# Patient Record
Sex: Female | Born: 1990 | Race: Asian | Hispanic: No | Marital: Married | Smoking: Never smoker
Health system: Southern US, Community
[De-identification: ages and names within clinical notes are randomized; demographics above are authoritative.]

## PROBLEM LIST (undated history)

## (undated) ENCOUNTER — Inpatient Hospital Stay (HOSPITAL_COMMUNITY): Payer: Self-pay

## (undated) DIAGNOSIS — K219 Gastro-esophageal reflux disease without esophagitis: Secondary | ICD-10-CM

---

## 2012-03-25 ENCOUNTER — Encounter (HOSPITAL_COMMUNITY): Payer: Self-pay | Admitting: *Deleted

## 2012-03-25 ENCOUNTER — Emergency Department (INDEPENDENT_AMBULATORY_CARE_PROVIDER_SITE_OTHER)
Admission: EM | Admit: 2012-03-25 | Discharge: 2012-03-25 | Disposition: A | Discharge status: Home / Self Care | Payer: Medicaid Other | Source: Home / Self Care

## 2012-03-25 DIAGNOSIS — K120 Recurrent oral aphthae: Secondary | ICD-10-CM

## 2012-03-25 MED ORDER — CHLORHEXIDINE GLUCONATE 0.12 % MT SOLN: 15.0000 mL | Freq: Two times a day (BID) | OROMUCOSAL | Status: DC

## 2012-03-25 NOTE — ED Notes (Signed)
Pt reports mouth sores for more than three days

## 2012-03-25 NOTE — ED Provider Notes (Signed)
History     CSN: [id number]  Arrival date & time 03/25/12  1245   None     Chief Complaint  Patient presents with  . Mouth Lesions    (Consider location/radiation/quality/duration/timing/severity/associated sxs/prior treatment) HPI Comments: Pt reports having mouth sores on and off for a year.  They come and usually resolve on their own after a week or two.     Patient is a 21 y.o. female presenting with mouth sores. The history is provided by the patient and a relative. A language interpreter was used (friend).  Mouth Lesions  The current episode started more than 1 week ago. The problem occurs continuously. The problem has been unchanged. The problem is moderate. Nothing relieves the symptoms. Nothing aggravates the symptoms. Associated symptoms include mouth sores. Pertinent negatives include no fever.    History reviewed. No pertinent past medical history.  History reviewed. No pertinent past surgical history.  Family History  Problem Relation Age of Onset  . Family history unknown: Yes    History  Substance Use Topics  . Smoking status: Not on file  . Smokeless tobacco: Not on file  . Alcohol Use: No    OB History    Grav Para Term Preterm Abortions TAB SAB Ect Mult Living   2               Review of Systems  Constitutional: Negative for fever, chills and fatigue.  HENT: Positive for mouth sores.     Allergies  Review of patient's allergies indicates no known allergies.  Home Medications   Current Outpatient Rx  Name Route Sig Dispense Refill  . CHLORHEXIDINE GLUCONATE 0.12 % MT SOLN Mouth/Throat Use as directed 15 mLs in the mouth or throat 2 (two) times daily. 120 mL 0    BP 119/67  Pulse 77  Temp 98.5 F (36.9 C) (Oral)  Resp 20  SpO2 100%  Physical Exam  Constitutional: She appears well-developed and well-nourished. No distress.  HENT:  Mouth/Throat: Oropharynx is clear and moist. Oral lesions present.       3 small 2mm oval shaped  ulcerations inner lower lip c/w apthous ulcers  Pulmonary/Chest: Effort normal.  Lymphadenopathy:       Head (right side): No submental, no submandibular and no tonsillar adenopathy present.       Head (left side): No submental, no submandibular and no tonsillar adenopathy present.    ED Course  Procedures (including critical care time)  Labs Reviewed - No data to display No results found.   1. Aphthous ulcer       MDM          Cathlyn Parsons, NP 03/25/12 1426

## 2012-03-25 NOTE — ED Provider Notes (Signed)
Medical screening examination/treatment/procedure(s) were performed by resident physician or non-physician practitioner and as supervising physician I was immediately available for consultation/collaboration.   Barkley Bruns MD.    Linna Hoff, MD 03/25/12 1901

## 2012-09-17 ENCOUNTER — Encounter (HOSPITAL_COMMUNITY): Payer: Self-pay | Admitting: Emergency Medicine

## 2012-09-17 ENCOUNTER — Emergency Department (HOSPITAL_COMMUNITY)
Admission: EM | Admit: 2012-09-17 | Discharge: 2012-09-17 | Disposition: A | Discharge status: Home / Self Care | Payer: Medicaid Other | Attending: Emergency Medicine | Admitting: Emergency Medicine

## 2012-09-17 DIAGNOSIS — Y929 Unspecified place or not applicable: Secondary | ICD-10-CM | POA: Insufficient documentation

## 2012-09-17 DIAGNOSIS — X118XXA Contact with other hot tap-water, initial encounter: Secondary | ICD-10-CM | POA: Insufficient documentation

## 2012-09-17 DIAGNOSIS — Z23 Encounter for immunization: Secondary | ICD-10-CM | POA: Insufficient documentation

## 2012-09-17 DIAGNOSIS — T2122XA Burn of second degree of abdominal wall, initial encounter: Secondary | ICD-10-CM | POA: Insufficient documentation

## 2012-09-17 DIAGNOSIS — Y9389 Activity, other specified: Secondary | ICD-10-CM | POA: Insufficient documentation

## 2012-09-17 DIAGNOSIS — T24219A Burn of second degree of unspecified thigh, initial encounter: Secondary | ICD-10-CM | POA: Insufficient documentation

## 2012-09-17 DIAGNOSIS — T24012A Burn of unspecified degree of left thigh, initial encounter: Secondary | ICD-10-CM

## 2012-09-17 DIAGNOSIS — T310 Burns involving less than 10% of body surface: Secondary | ICD-10-CM | POA: Insufficient documentation

## 2012-09-17 MED ORDER — IBUPROFEN 800 MG PO TABS: 800.0000 mg | ORAL_TABLET | Freq: Three times a day (TID) | ORAL | Status: DC

## 2012-09-17 MED ORDER — IBUPROFEN 400 MG PO TABS
800.0000 mg | ORAL_TABLET | Freq: Once | ORAL | Status: AC
Start: 2012-09-17 — End: 2012-09-17
  Administered 2012-09-17: 800 mg via ORAL
  Filled 2012-09-17: qty 2

## 2012-09-17 MED ORDER — FENTANYL CITRATE 0.05 MG/ML IJ SOLN
100.0000 ug | Freq: Once | INTRAMUSCULAR | Status: AC
  Administered 2012-09-17: 100 ug via NASAL
  Filled 2012-09-17: qty 2

## 2012-09-17 MED ORDER — OXYCODONE-ACETAMINOPHEN 5-325 MG PO TABS
2.0000 | ORAL_TABLET | Freq: Once | ORAL | Status: AC
  Administered 2012-09-17: 2 via ORAL
  Filled 2012-09-17: qty 2

## 2012-09-17 MED ORDER — ONDANSETRON 4 MG PO TBDP: 8.0000 mg | ORAL_TABLET | Freq: Once | ORAL | Status: DC

## 2012-09-17 MED ORDER — ONDANSETRON 4 MG PO TBDP
ORAL_TABLET | ORAL | Status: AC
  Filled 2012-09-17: qty 2

## 2012-09-17 MED ORDER — OXYCODONE-ACETAMINOPHEN 5-325 MG PO TABS: 2.0000 | ORAL_TABLET | ORAL | Status: DC | PRN

## 2012-09-17 MED ORDER — TETANUS-DIPHTH-ACELL PERTUSSIS 5-2.5-18.5 LF-MCG/0.5 IM SUSP
0.5000 mL | Freq: Once | INTRAMUSCULAR | Status: AC
  Administered 2012-09-17: 0.5 mL via INTRAMUSCULAR
  Filled 2012-09-17: qty 0.5

## 2012-09-17 NOTE — ED Notes (Signed)
Pt sts feeling dizzy and weak all over when giving tetanus shot. Dr bednar notified vitals stable. Pt laid down. sts feeling sleepy. Pt rested 10 min recheck- pt appears more alert sts wants to go home. Denies nasuea sts doesn't want to take nausea medication
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
## 2012-10-17 ENCOUNTER — Encounter (HOSPITAL_COMMUNITY): Payer: Self-pay | Admitting: Adult Health

## 2012-10-17 ENCOUNTER — Emergency Department (HOSPITAL_COMMUNITY)
Admission: EM | Admit: 2012-10-17 | Discharge: 2012-10-17 | Disposition: A | Discharge status: Home / Self Care | Payer: Medicaid Other | Attending: Emergency Medicine | Admitting: Emergency Medicine

## 2012-10-17 ENCOUNTER — Emergency Department (HOSPITAL_COMMUNITY): Payer: Medicaid Other

## 2012-10-17 DIAGNOSIS — R509 Fever, unspecified: Secondary | ICD-10-CM | POA: Insufficient documentation

## 2012-10-17 DIAGNOSIS — J309 Allergic rhinitis, unspecified: Secondary | ICD-10-CM | POA: Insufficient documentation

## 2012-10-17 DIAGNOSIS — J3489 Other specified disorders of nose and nasal sinuses: Secondary | ICD-10-CM | POA: Insufficient documentation

## 2012-10-17 MED ORDER — CETIRIZINE-PSEUDOEPHEDRINE ER 5-120 MG PO TB12: 1.0000 | ORAL_TABLET | Freq: Two times a day (BID) | ORAL | Status: DC

## 2012-10-17 NOTE — ED Notes (Signed)
Presents with 3 days of sore throat, ear ache, itchy and watery eyes, cough and nasal drainage. Denies fever. Pt speaks nepali

## 2012-10-17 NOTE — ED Provider Notes (Signed)
History    This chart was scribed for non-physician practitioner Glade Nurse, PA-C working with Loren Racer, MD by Gerlean Ren, ED Scribe. This patient was seen in room TR05C/TR05C and the patient's care was started at 9:00 PM.    CSN: [id number]  Arrival date & time 10/17/12  1955   First MD Initiated Contact with Patient 10/17/12 2038      Chief Complaint  Patient presents with  . Sore Throat     The history is provided by the patient. A language interpreter was used [place]).  Kayla Bender is a 22 y.o. female who presents to the Emergency Department complaining of 3 days of constant sore throat with associated rhinorrhea, nasal congestion, and mild cough worse during the day than at night.  Pt denies dyspnea, chest pain, SOB, abdominal pain, emesis, otalgia.      History reviewed. No pertinent past medical history.  History reviewed. No pertinent past surgical history.  History reviewed. No pertinent family history.  History  Substance Use Topics  . Smoking status: Not on file  . Smokeless tobacco: Not on file  . Alcohol Use: No    OB History   Grav Para Term Preterm Abortions TAB SAB Ect Mult Living   2               Review of Systems  Constitutional: Positive for fever (subjective). Negative for diaphoresis.  HENT: Positive for congestion, sore throat and rhinorrhea. Negative for ear pain, neck pain and neck stiffness.   Eyes: Negative for visual disturbance.  Respiratory: Negative for apnea, chest tightness and shortness of breath.   Cardiovascular: Negative for chest pain and palpitations.  Gastrointestinal: Negative for nausea, vomiting, diarrhea, constipation and abdominal distention.  Genitourinary: Negative for dysuria.  Musculoskeletal: Negative for gait problem.  Skin: Negative for rash.  Neurological: Negative for dizziness, weakness, light-headedness, numbness and headaches.    Allergies  Review of patient's allergies indicates no known  allergies.  Home Medications   Current Outpatient Rx  Name  Route  Sig  Dispense  Refill  . etonogestrel (IMPLANON) 68 MG IMPL implant   Subcutaneous   Inject 1 each into the skin once.           BP 99/52  Pulse 92  Temp(Src) 98.9 F (37.2 C) (Oral)  Resp 20  SpO2 99%  Physical Exam  Nursing note and vitals reviewed. Constitutional: She is oriented to person, place, and time. She appears well-developed and well-nourished. No distress.  HENT:  Head: Normocephalic and atraumatic.  Eyes: Conjunctivae and EOM are normal.  Neck: Normal range of motion. Neck supple.  No meningeal signs  Cardiovascular: Normal rate, regular rhythm and normal heart sounds.  Exam reveals no gallop and no friction rub.   No murmur heard. Pulmonary/Chest: Effort normal and breath sounds normal. No respiratory distress. She has no wheezes. She has no rales. She exhibits no tenderness.  Abdominal: Soft. Bowel sounds are normal. She exhibits no distension. There is no tenderness. There is no rebound and no guarding.  Musculoskeletal: Normal range of motion. She exhibits no edema and no tenderness.  Neurological: She is alert and oriented to person, place, and time. No cranial nerve deficit.  Skin: Skin is warm and dry. She is not diaphoretic. No erythema.    ED Course  Procedures (including critical care time) DIAGNOSTIC STUDIES: Oxygen Saturation is 99% on room air, normal by my interpretation.    COORDINATION OF CARE: 9:08 PM- Informed pt that I  will await results of strep test and chest XR to determine further treatment plan.  Pt verbalizes understanding.    Results for orders placed during the hospital encounter of 10/17/12  RAPID STREP SCREEN      Result Value Range   Streptococcus, Group A Screen (Direct) NEGATIVE  NEGATIVE    Dg Chest 2 View  10/17/2012  *RADIOLOGY REPORT*  Clinical Data: Sore throat.  CHEST - 2 VIEW  Comparison: None.  Findings: Lateral view degraded by patient arm  position.  Midline trachea.  Patient rotated to the right. Normal heart size and mediastinal contours. No pleural effusion or pneumothorax. Clear lungs.  IMPRESSION: No acute cardiopulmonary disease.   Original Report Authenticated By: Jeronimo Greaves, M.D.    Discharge Medication List as of 10/17/2012 10:43 PM    START taking these medications   Details  cetirizine-pseudoephedrine (ZYRTEC-D) 5-120 MG per tablet Take 1 tablet by mouth 2 (two) times daily., Starting 10/17/2012, Until Discontinued, Print      CONTINUE these medications which have NOT CHANGED   Details  etonogestrel (IMPLANON) 68 MG IMPL implant Inject 1 each into the skin once., Historical Med         1. Allergic rhinitis       MDM  22 y.o. female complaining of 3 days of sore throat, clear rhinorrhea, itchy/watery eyes, nasal congestion, and mild cough worse during the day. Pt is afebrile. Lack of exudates, mild erythematous throat, and neg strep not concerning for strep. No sinus tenderness to percussion. Conjunctiva mildly injected. PE consistent with allergic rhinitis. CXR negative. Lungs CTA. Presentation consistent with allergic rhinitis. Will prescribe Zyrtec D for relief of symptoms. Encouraged pt to establish relationship with PCP for follow up and provided resource guide. Pt understood and was in agreement with sx.   I personally performed the services described in this documentation, which was scribed in my presence. The recorded information has been reviewed and is accurate.    Glade Nurse, PA-C 10/18/12 [phone]

## 2012-10-17 NOTE — ED Notes (Signed)
Language line contacted to assess pt.;  Pt stated to interpreter that she has had runny nose, itching in eyes, ears, nose, and throat for three days with water coming from eyes.  She also complains of itching of skin around left side of neck.

## 2012-10-18 NOTE — ED Provider Notes (Signed)
Medical screening examination/treatment/procedure(s) were performed by non-physician practitioner and as supervising physician I was immediately available for consultation/collaboration.   Valeta Paz, MD 10/18/12 2153 

## 2012-10-21 ENCOUNTER — Emergency Department (INDEPENDENT_AMBULATORY_CARE_PROVIDER_SITE_OTHER): Payer: Medicaid Other

## 2012-10-21 ENCOUNTER — Emergency Department (INDEPENDENT_AMBULATORY_CARE_PROVIDER_SITE_OTHER)
Admission: EM | Admit: 2012-10-21 | Discharge: 2012-10-21 | Disposition: A | Discharge status: Home / Self Care | Payer: Medicaid Other | Source: Home / Self Care | Attending: Emergency Medicine | Admitting: Emergency Medicine

## 2012-10-21 ENCOUNTER — Encounter (HOSPITAL_COMMUNITY): Payer: Self-pay | Admitting: Emergency Medicine

## 2012-10-21 DIAGNOSIS — B349 Viral infection, unspecified: Secondary | ICD-10-CM

## 2012-10-21 DIAGNOSIS — B9789 Other viral agents as the cause of diseases classified elsewhere: Secondary | ICD-10-CM

## 2012-10-21 LAB — CBC WITH DIFFERENTIAL/PLATELET
Basophils Absolute: 0 10*3/uL (ref 0.0–0.1)
Eosinophils Absolute: 0.1 10*3/uL (ref 0.0–0.7)
Eosinophils Relative: 1 % (ref 0–5)
HCT: 33.5 % — ABNORMAL LOW (ref 36.0–46.0)
Lymphs Abs: 3.8 10*3/uL (ref 0.7–4.0)
MCH: 30.3 pg (ref 26.0–34.0)
MCV: 86.1 fL (ref 78.0–100.0)
Monocytes Absolute: 0.7 10*3/uL (ref 0.1–1.0)
Platelets: 114 10*3/uL — ABNORMAL LOW (ref 150–400)
RDW: 12.9 % (ref 11.5–15.5)

## 2012-10-21 LAB — POCT URINALYSIS DIP (DEVICE)
Glucose, UA: NEGATIVE mg/dL
Hgb urine dipstick: NEGATIVE
Nitrite: NEGATIVE
Protein, ur: NEGATIVE mg/dL
Specific Gravity, Urine: 1.01 (ref 1.005–1.030)
Urobilinogen, UA: 0.2 mg/dL (ref 0.0–1.0)

## 2012-10-21 LAB — POCT I-STAT, CHEM 8
Creatinine, Ser: 0.7 mg/dL (ref 0.50–1.10)
Glucose, Bld: 84 mg/dL (ref 70–99)
HCT: 37 % (ref 36.0–46.0)
Hemoglobin: 12.6 g/dL (ref 12.0–15.0)
Potassium: 3.5 mEq/L (ref 3.5–5.1)
TCO2: 26 mmol/L (ref 0–100)

## 2012-10-21 MED ORDER — ACETAMINOPHEN 325 MG PO TABS
650.0000 mg | ORAL_TABLET | Freq: Once | ORAL | Status: AC
  Administered 2012-10-21: 650 mg via ORAL

## 2012-10-21 MED ORDER — ONDANSETRON HCL 8 MG PO TABS: 8.0000 mg | ORAL_TABLET | Freq: Three times a day (TID) | ORAL | Status: DC | PRN

## 2012-10-21 MED ORDER — OXYCODONE-ACETAMINOPHEN 5-325 MG PO TABS: ORAL_TABLET | ORAL | Status: DC

## 2012-10-21 MED ORDER — ACETAMINOPHEN 325 MG PO TABS
ORAL_TABLET | ORAL | Status: AC
  Filled 2012-10-21: qty 2

## 2012-10-21 MED ORDER — ONDANSETRON HCL 4 MG/2ML IJ SOLN
INTRAMUSCULAR | Status: AC
  Filled 2012-10-21: qty 2

## 2012-10-21 MED ORDER — TRIAMCINOLONE ACETONIDE 0.1 % EX CREA: TOPICAL_CREAM | Freq: Three times a day (TID) | CUTANEOUS | Status: DC

## 2012-10-21 MED ORDER — ONDANSETRON HCL 4 MG/2ML IJ SOLN
4.0000 mg | Freq: Once | INTRAMUSCULAR | Status: AC
  Administered 2012-10-21: 4 mg via INTRAMUSCULAR

## 2012-10-21 MED ORDER — SODIUM CHLORIDE 0.9 % IV SOLN
INTRAVENOUS | Status: DC
  Administered 2012-10-21: 17:00:00 via INTRAVENOUS

## 2012-10-21 NOTE — ED Provider Notes (Signed)
Chief Complaint:  No chief complaint on file.   History of Present Illness:   Kayla Bender is a 22 year old Nepali female who presents today with her husband and 2 small children. She speaks limited [place], so history was obtained with the help of a telephone interpreter. The past week she's had sore throat, nasal congestion, rhinorrhea, and cough. She was seen for these symptoms on April 23, 4 days ago at the emergency department. A strep test was negative and a chest x-ray was negative. She was treated symptomatically and sent home. She continues with the above-mentioned symptoms but also notes that she feels dizzy, weak, numb and tingly in her extremities, she's had some back pain, fever, and headache. She continues to have cough productive of white sputum, chest pain, sore throat, difficulty breathing, nausea, generalized abdominal pain, and loose stools. She denies any vomiting, blood in the stool, urinary symptoms, or GYN complaints. She also mentions today a four-month history of a rash and dark discoloration of her skin in various places including her forehead, neck, and arms.  Review of Systems:  Other than noted above, the patient denies any of the following symptoms. Systemic:  No fever, chills, sweats, fatigue, myalgias, headache, or anorexia. Eye:  No redness, pain or drainage. ENT:  No earache, nasal congestion, rhinorrhea, sinus pressure, or sore throat. Lungs:  No cough, sputum production, wheezing, shortness of breath.  Cardiovascular:  No chest pain, palpitations, or syncope. GI:  No nausea, vomiting, abdominal pain or diarrhea. GU:  No dysuria, frequency, or hematuria. Skin:  No rash or pruritis.  PMFSH:  Past medical history, family history, social history, meds, and allergies were reviewed.  She has a Norplant IUD and has not had menstrual periods.  Physical Exam:   Vital signs:  BP 102/62  Pulse 86  Temp(Src) 99 F (37.2 C) (Oral)  Resp 16  SpO2 100% General:  She is  alert but drowsy and listless and is lying, curled up in a fetal position on the exam table covered up with a blanket. Her husband does most of the talking for her. Eye:  PERRL, full EOMs.  Lids and conjunctivas were normal. ENT:  TMs and canals were normal, without erythema or inflammation.  Nasal mucosa was clear and uncongested, without drainage.  Mucous membranes were moist.  Pharynx was clear, without exudate or drainage.  There were no oral ulcerations or lesions. Neck:  Supple, no adenopathy, tenderness or mass. Thyroid was normal. Lungs:  No respiratory distress.  Lungs were clear to auscultation, without wheezes, rales or rhonchi.  Breath sounds were clear and equal bilaterally. Heart:  Regular rhythm, without gallops, murmers or rubs. Abdomen:  Soft, flat, with mild, generalized tenderness to palpation, no guarding or rebound.  No hepatosplenomagaly or mass. Bowel sounds are hyperactive. Skin:  Clear, warm, and dry, without rash or lesions. I was unable to appreciate any definite rash. Some of the areas that she indicated were slightly dry and eczematous.  Labs:   Results for orders placed during the hospital encounter of 10/21/12  CBC WITH DIFFERENTIAL      Result Value Range   WBC 9.6  4.0 - 10.5 K/uL   RBC 3.89  3.87 - 5.11 MIL/uL   Hemoglobin 11.8 (*) 12.0 - 15.0 g/dL   HCT 84.1 (*) 32.4 - 40.1 %   MCV 86.1  78.0 - 100.0 fL   MCH 30.3  26.0 - 34.0 pg   MCHC 35.2  30.0 - 36.0 g/dL  RDW 12.9  11.5 - 15.5 %   Platelets 114 (*) 150 - 400 K/uL   Neutrophils Relative 52  43 - 77 %   Neutro Abs 5.0  1.7 - 7.7 K/uL   Lymphocytes Relative 39  12 - 46 %   Lymphs Abs 3.8  0.7 - 4.0 K/uL   Monocytes Relative 7  3 - 12 %   Monocytes Absolute 0.7  0.1 - 1.0 K/uL   Eosinophils Relative 1  0 - 5 %   Eosinophils Absolute 0.1  0.0 - 0.7 K/uL   Basophils Relative 0  0 - 1 %   Basophils Absolute 0.0  0.0 - 0.1 K/uL  POCT URINALYSIS DIP (DEVICE)      Result Value Range   Glucose, UA  NEGATIVE  NEGATIVE mg/dL   Bilirubin Urine NEGATIVE  NEGATIVE   Ketones, ur NEGATIVE  NEGATIVE mg/dL   Specific Gravity, Urine 1.010  1.005 - 1.030   Hgb urine dipstick NEGATIVE  NEGATIVE   pH 6.5  5.0 - 8.0   Protein, ur NEGATIVE  NEGATIVE mg/dL   Urobilinogen, UA 0.2  0.0 - 1.0 mg/dL   Nitrite NEGATIVE  NEGATIVE   Leukocytes, UA NEGATIVE  NEGATIVE  POCT I-STAT, CHEM 8      Result Value Range   Sodium 142  135 - 145 mEq/L   Potassium 3.5  3.5 - 5.1 mEq/L   Chloride 103  96 - 112 mEq/L   BUN 15  6 - 23 mg/dL   Creatinine, Ser 1.61  0.50 - 1.10 mg/dL   Glucose, Bld 84  70 - 99 mg/dL   Calcium, Ion 0.96 (*) 1.12 - 1.23 mmol/L   TCO2 26  0 - 100 mmol/L   Hemoglobin 12.6  12.0 - 15.0 g/dL   HCT 04.5  40.9 - 81.1 %  POCT PREGNANCY, URINE      Result Value Range   Preg Test, Ur NEGATIVE  NEGATIVE     Radiology:  Dg Chest 2 View  10/21/2012  *RADIOLOGY REPORT*  Clinical Data: Cough and fever for the past week.  CHEST - 2 VIEW  Comparison: Chest x-ray 10/17/2012.  Findings: Lung volumes are normal.  No consolidative airspace disease.  No pleural effusions.  No pneumothorax.  No pulmonary nodule or mass noted.  Pulmonary vasculature and the cardiomediastinal silhouette are within normal limits.  IMPRESSION: 1. No radiographic evidence of acute cardiopulmonary disease.   Original Report Authenticated By: Trudie Reed, M.D.    Course in Urgent Care Center:   She was begun on IV normal saline and given a total of a liter while she was here. She was given Tylenol for the fever and the pain, and Zofran 4 mg IV for the nausea. She was given some ginger ale to drink and some crackers to eat. She ate a few crackers but did not feel like drinking the ginger ale. After treatment, she stated her back pain was down from a 5 to a 4 in intensity and her nausea is gone away completely. She felt like she could go home at this point. I discussed with the husband and her about the possibility of transfer to  the hospital for further evaluation, but they were both inclined to go home.  Assessment:  The encounter diagnosis was Viral syndrome.  This appears to be a viral syndrome, although it has gone on for a very long time. I'm also somewhat concerned about the somewhat low platelet count. I have asked  him to come back tomorrow and would like to recheck her CBC at that time. If she's not getting better, or if the platelet count keeps dropping, she'll need to be transferred to the hospital. The rash that she mentions may be eczema, and at [facility] triamcinolone cream for that. She was given oxycodone for the pain and Zofran for the nausea. I recommended plenty of liquids and rest.  Plan:   1.  The following meds were prescribed:   New Prescriptions   ONDANSETRON (ZOFRAN) 8 MG TABLET    Take 1 tablet (8 mg total) by mouth every 8 (eight) hours as needed for nausea.   OXYCODONE-ACETAMINOPHEN (PERCOCET) 5-325 MG PER TABLET    1 to 2 tablets every 6 hours as needed for pain.   TRIAMCINOLONE CREAM (KENALOG) 0.1 %    Apply topically 3 (three) times daily.   2.  The patient was instructed in symptomatic care and handouts were given. 3.  The patient was told to return if becoming worse in any way, if no better in 3 or 4 days, and given some red flag symptoms such as worsening fever, worsening pain, or any type of respiratory problem that would indicate earlier return.  Follow up:  The patient was told to follow up here tomorrow for recheck. She will need a repeat CBC.     Reuben Likes, MD 10/21/12 [phone]
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
## 2012-10-21 NOTE — ED Notes (Addendum)
C/o dizzy and week with back pain for 1 week.  C/o feeling feverish with numbness in hand and legs.  Weak non-productive cough.  C/o nasuea. C/o chest pain off and on.  C/o cold symptoms with sorethroat.  No vomiting.  C/o abdominal pain  And cramping.  Stool is soft and watery.  No blood in stool.  No dysuria or hematuria.  No itching or burning.  No periods on Norplant for birth control. History obtained with [place] interpreters, with husband and Dr. Lorenz Coaster present.

## 2012-10-22 ENCOUNTER — Emergency Department (INDEPENDENT_AMBULATORY_CARE_PROVIDER_SITE_OTHER)
Admission: EM | Admit: 2012-10-22 | Discharge: 2012-10-22 | Disposition: A | Discharge status: Home / Self Care | Payer: Medicaid Other | Source: Home / Self Care | Attending: Family Medicine | Admitting: Family Medicine

## 2012-10-22 ENCOUNTER — Encounter (HOSPITAL_COMMUNITY): Payer: Self-pay

## 2012-10-22 DIAGNOSIS — B9789 Other viral agents as the cause of diseases classified elsewhere: Secondary | ICD-10-CM

## 2012-10-22 DIAGNOSIS — B349 Viral infection, unspecified: Secondary | ICD-10-CM

## 2012-10-22 NOTE — ED Provider Notes (Signed)
History     CSN: [id number]  Arrival date & time 10/22/12  1433   First MD Initiated Contact with Patient 10/22/12 1549      No chief complaint on file.   (Consider location/radiation/quality/duration/timing/severity/associated sxs/prior treatment) Patient is a 22 y.o. female presenting with general illness. The history is provided by the patient and the spouse. The history is limited by a language barrier.  Illness  The current episode started yesterday (seen at [facility] on 4/27 but today sx much improved.). The problem has been rapidly improving. The problem is mild.    No past medical history on file.  No past surgical history on file.  No family history on file.  History  Substance Use Topics  . Smoking status: Not on file  . Smokeless tobacco: Not on file  . Alcohol Use: No    OB History   Grav Para Term Preterm Abortions TAB SAB Ect Mult Living   2               Review of Systems  Constitutional: Negative.   Respiratory: Negative.   Gastrointestinal: Negative.     Allergies  Review of patient's allergies indicates no known allergies.  Home Medications   Current Outpatient Rx  Name  Route  Sig  Dispense  Refill  . cetirizine-pseudoephedrine (ZYRTEC-D) 5-120 MG per tablet   Oral   Take 1 tablet by mouth 2 (two) times daily.   30 tablet   0   . etonogestrel (IMPLANON) 68 MG IMPL implant   Subcutaneous   Inject 1 each into the skin once.         . ondansetron (ZOFRAN) 8 MG tablet   Oral   Take 1 tablet (8 mg total) by mouth every 8 (eight) hours as needed for nausea.   20 tablet   0   . oxyCODONE-acetaminophen (PERCOCET) 5-325 MG per tablet      1 to 2 tablets every 6 hours as needed for pain.   20 tablet   0   . triamcinolone cream (KENALOG) 0.1 %   Topical   Apply topically 3 (three) times daily.   85.2 g   2     BP 104/59  Pulse 76  Temp(Src) 98 F (36.7 C) (Oral)  Resp 16  SpO2 100%  Physical Exam  Nursing note and vitals  reviewed. Constitutional: She is oriented to person, place, and time. She appears well-developed and well-nourished. No distress.  HENT:  Head: Normocephalic.  Mouth/Throat: Oropharynx is clear and moist.  Eyes: Pupils are equal, round, and reactive to light.  Cardiovascular: Normal rate, normal heart sounds and intact distal pulses.   Pulmonary/Chest: Effort normal and breath sounds normal.  Abdominal: Soft. Bowel sounds are normal.  Neurological: She is alert and oriented to person, place, and time.  Skin: Skin is warm and dry.    ED Course  Procedures (including critical care time)  Labs Reviewed - No data to display Dg Chest 2 View  10/21/2012  *RADIOLOGY REPORT*  Clinical Data: Cough and fever for the past week.  CHEST - 2 VIEW  Comparison: Chest x-ray 10/17/2012.  Findings: Lung volumes are normal.  No consolidative airspace disease.  No pleural effusions.  No pneumothorax.  No pulmonary nodule or mass noted.  Pulmonary vasculature and the cardiomediastinal silhouette are within normal limits.  IMPRESSION: 1. No radiographic evidence of acute cardiopulmonary disease.   Original Report Authenticated By: Trudie Reed, M.D.      1. Acute  viral syndrome       MDM  Do not see indication for cbc at this time, clinically much improved status.        Linna Hoff, MD 10/22/12 1630

## 2012-10-22 NOTE — ED Notes (Signed)
recheck

## 2012-11-24 ENCOUNTER — Emergency Department (HOSPITAL_COMMUNITY)
Admission: EM | Admit: 2012-11-24 | Discharge: 2012-11-25 | Disposition: A | Discharge status: Home / Self Care | Payer: Medicaid Other | Attending: Emergency Medicine | Admitting: Emergency Medicine

## 2012-11-24 ENCOUNTER — Encounter (HOSPITAL_COMMUNITY): Payer: Self-pay | Admitting: Emergency Medicine

## 2012-11-24 DIAGNOSIS — Z792 Long term (current) use of antibiotics: Secondary | ICD-10-CM | POA: Insufficient documentation

## 2012-11-24 DIAGNOSIS — Z79899 Other long term (current) drug therapy: Secondary | ICD-10-CM | POA: Insufficient documentation

## 2012-11-24 DIAGNOSIS — J351 Hypertrophy of tonsils: Secondary | ICD-10-CM

## 2012-11-24 MED ORDER — CLINDAMYCIN PHOSPHATE 900 MG/50ML IV SOLN
900.0000 mg | Freq: Once | INTRAVENOUS | Status: AC
  Administered 2012-11-25: 900 mg via INTRAVENOUS
  Filled 2012-11-24: qty 50

## 2012-11-24 MED ORDER — SODIUM CHLORIDE 0.9 % IV SOLN
Freq: Once | INTRAVENOUS | Status: AC
  Administered 2012-11-25: 01:00:00 via INTRAVENOUS

## 2012-11-24 NOTE — ED Notes (Signed)
Patient with sore throat for last two days.  Patient now with painful swallowing.  NAD, patient with easy respirations, no distress.

## 2012-11-25 ENCOUNTER — Emergency Department (HOSPITAL_COMMUNITY): Payer: Medicaid Other

## 2012-11-25 ENCOUNTER — Encounter (HOSPITAL_COMMUNITY): Payer: Self-pay | Admitting: Radiology

## 2012-11-25 LAB — CBC WITH DIFFERENTIAL/PLATELET
Basophils Absolute: 0 10*3/uL (ref 0.0–0.1)
HCT: 34.8 % — ABNORMAL LOW (ref 36.0–46.0)
Lymphocytes Relative: 43 % (ref 12–46)
Lymphs Abs: 3.7 10*3/uL (ref 0.7–4.0)
MCV: 88.3 fL (ref 78.0–100.0)
Neutro Abs: 4.1 10*3/uL (ref 1.7–7.7)
Platelets: 151 10*3/uL (ref 150–400)
RBC: 3.94 MIL/uL (ref 3.87–5.11)
RDW: 13.1 % (ref 11.5–15.5)
WBC: 8.5 10*3/uL (ref 4.0–10.5)

## 2012-11-25 LAB — POCT I-STAT, CHEM 8
BUN: 13 mg/dL (ref 6–23)
Chloride: 105 mEq/L (ref 96–112)
Creatinine, Ser: 0.7 mg/dL (ref 0.50–1.10)
Potassium: 3.5 mEq/L (ref 3.5–5.1)
Sodium: 142 mEq/L (ref 135–145)

## 2012-11-25 MED ORDER — TRAMADOL HCL 50 MG PO TABS: 50.0000 mg | ORAL_TABLET | Freq: Four times a day (QID) | ORAL | Status: DC | PRN

## 2012-11-25 MED ORDER — IOHEXOL 300 MG/ML  SOLN
75.0000 mL | Freq: Once | INTRAMUSCULAR | Status: AC | PRN
  Administered 2012-11-25: 75 mL via INTRAVENOUS

## 2012-11-25 MED ORDER — CLINDAMYCIN HCL 150 MG PO CAPS: 150.0000 mg | ORAL_CAPSULE | Freq: Four times a day (QID) | ORAL | Status: DC

## 2012-11-25 NOTE — ED Provider Notes (Signed)
History     CSN: [id number]  Arrival date & time 11/24/12  2030   First MD Initiated Contact with Patient 11/24/12 2340      Chief Complaint  Patient presents with  . Sore Throat    (Consider location/radiation/quality/duration/timing/severity/associated sxs/prior treatment) HPI Comments: Patient presents with 2 days of sore throat, more on the right than the left painful swallowing.  She is taking over-the-counter medication.  One or 2 times without relief.  Denies any URI symptoms, ear pain, cough, myalgias, or fever  Patient is a 22 y.o. female presenting with pharyngitis. The history is provided by the patient and the spouse.  Sore Throat This is a new problem. The current episode started in the past 7 days. The problem has been rapidly worsening. Associated symptoms include neck pain, a sore throat and swollen glands. Pertinent negatives include no abdominal pain, chills, coughing, fever, myalgias, nausea or vomiting. The symptoms are aggravated by drinking and eating. She has tried NSAIDs for the symptoms. The treatment provided no relief.    History reviewed. No pertinent past medical history.  History reviewed. No pertinent past surgical history.  No family history on file.  History  Substance Use Topics  . Smoking status: Never Smoker   . Smokeless tobacco: Not on file  . Alcohol Use: No    OB History   Grav Para Term Preterm Abortions TAB SAB Ect Mult Living   2               Review of Systems  Constitutional: Negative for fever and chills.  HENT: Positive for sore throat and neck pain. Negative for ear pain, rhinorrhea and neck stiffness.   Respiratory: Negative for cough.   Gastrointestinal: Negative for nausea, vomiting and abdominal pain.  Musculoskeletal: Negative for myalgias.  All other systems reviewed and are negative.    Allergies  Review of patient's allergies indicates no known allergies.  Home Medications   Current Outpatient Rx  Name   Route  Sig  Dispense  Refill  . etonogestrel (IMPLANON) 68 MG IMPL implant   Subcutaneous   Inject 1 each into the skin once.         . clindamycin (CLEOCIN) 150 MG capsule   Oral   Take 1 capsule (150 mg total) by mouth every 6 (six) hours.   28 capsule   0   . traMADol (ULTRAM) 50 MG tablet   Oral   Take 1 tablet (50 mg total) by mouth every 6 (six) hours as needed for pain.   15 tablet   0     BP 95/44  Pulse 64  Temp(Src) 97.7 F (36.5 C) (Oral)  Resp 16  SpO2 100%  Physical Exam  Nursing note and vitals reviewed. Constitutional: She is oriented to person, place, and time. She appears well-developed and well-nourished.  HENT:  Head: Normocephalic and atraumatic.  Right Ear: External ear normal.  Left Ear: External ear normal.  Patient has asymmetrical adenopathy in the posterior pharynx right greater than left negative.  Strep test  Eyes: Pupils are equal, round, and reactive to light.  Neck: Normal range of motion and full passive range of motion without pain. Neck supple. No erythema and normal range of motion present. No thyromegaly present.  Cardiovascular: Normal rate.   Pulmonary/Chest: Effort normal.  Abdominal: Soft. Bowel sounds are normal.  Musculoskeletal: Normal range of motion.  Lymphadenopathy:    She has cervical adenopathy.  Neurological: She is alert and oriented to person,  place, and time.  Skin: Skin is warm.    ED Course  Procedures (including critical care time)  Labs Reviewed  CBC WITH DIFFERENTIAL - Abnormal; Notable for the following:    Hemoglobin 11.8 (*)    HCT 34.8 (*)    All other components within normal limits  POCT I-STAT, CHEM 8 - Abnormal; Notable for the following:    Glucose, Bld 108 (*)    Hemoglobin 11.9 (*)    HCT 35.0 (*)    All other components within normal limits  RAPID STREP SCREEN  CULTURE, GROUP A STREP   Ct Soft Tissue Neck W Contrast  11/25/2012   *RADIOLOGY REPORT*  Clinical Data: Sore throat  CT NECK  WITH CONTRAST  Technique:  Multidetector CT imaging of the neck was performed with intravenous contrast.  Contrast: 75mL OMNIPAQUE IOHEXOL 300 MG/ML  SOLN  Comparison: None.  Findings: Visualized intracranial contents are within normal limits.  Unremarkable nasal cavity and nasopharynx, oral cavity.  Mild tonsillar prominence on the right.  No peritonsillar abscess.  Unremarkable epiglottis, hypopharynx, larynx.  Symmetric submandibular and parotid glands.  No lymphadenopathy  Unremarkable thyroid gland.  Upper lungs clear.  No acute osseous finding. Prevertebral soft tissue within normal limits.  Patent vasculature.  IMPRESSION: Mild tonsillar enlargement may reflect inflammation.  No peritonsillar abscess.   Original Report Authenticated By: Jearld Lesch, M.D.     1. Tonsillar enlargement       MDM   Concern for peritonsillar abscess.  CT scan, shows, that it's just a single tonsillar adenopathy without posterior pharynx abscess.  Will discharge patient him with antibiotics, and ENT followup        Arman Filter, NP 11/25/12 [phone]

## 2012-11-25 NOTE — ED Provider Notes (Signed)
Medical screening examination/treatment/procedure(s) were performed by non-physician practitioner and as supervising physician I was immediately available for consultation/collaboration.  Juliet Rude. Rubin Payor, MD 11/25/12 [phone]

## 2012-11-25 NOTE — ED Notes (Addendum)
Pt states that she has had a sore throat since 3 days ago. Pt states that the pain is severe an she is unable to swallow regularly. Pt has severe redness to back of throat concerning for a peritonsiler abscess.

## 2012-11-25 NOTE — ED Notes (Signed)
The pts iv hung  And antibiotic

## 2012-11-27 LAB — CULTURE, GROUP A STREP

## 2012-12-02 ENCOUNTER — Emergency Department (INDEPENDENT_AMBULATORY_CARE_PROVIDER_SITE_OTHER)
Admission: EM | Admit: 2012-12-02 | Discharge: 2012-12-02 | Disposition: A | Discharge status: Home / Self Care | Payer: Medicaid Other | Source: Home / Self Care | Attending: Emergency Medicine | Admitting: Emergency Medicine

## 2012-12-02 ENCOUNTER — Encounter (HOSPITAL_COMMUNITY): Payer: Self-pay

## 2012-12-02 DIAGNOSIS — K2970 Gastritis, unspecified, without bleeding: Secondary | ICD-10-CM

## 2012-12-02 MED ORDER — OMEPRAZOLE 2 MG/ML ORAL SUSPENSION: 40.0000 mg | Freq: Every day | ORAL | Status: DC

## 2012-12-02 MED ORDER — SUCRALFATE 1 G PO TABS: 1.0000 g | ORAL_TABLET | Freq: Every evening | ORAL | Status: DC | PRN

## 2012-12-02 NOTE — ED Provider Notes (Addendum)
History     CSN: [id number]  Arrival date & time 12/02/12  1205   First MD Initiated Contact with Patient 12/02/12 1255      Chief Complaint  Patient presents with  . Abdominal Pain    epigastric     (Consider location/radiation/quality/duration/timing/severity/associated sxs/prior treatment) HPI Comments: Patient presents urgent care complaining of abdominal pain and pain when she swallows. Her husband accompanies her to today's visit. He describes that when she eats or during this she hurts points to retrosternal and epigastric region. No fevers nausea vomiting or diarrhea. Has not taken any medicines over-the-counter. No urinary symptoms.   Patient is a 22 y.o. female presenting with abdominal pain. The history is provided by the patient and the spouse.  Abdominal Pain This is a new problem. The current episode started 2 days ago. The problem occurs constantly. The problem has been gradually worsening. Associated symptoms include abdominal pain. Pertinent negatives include no chest pain, no headaches and no shortness of breath. The symptoms are aggravated by swallowing. Nothing relieves the symptoms. She has tried nothing for the symptoms.    History reviewed. No pertinent past medical history.  History reviewed. No pertinent past surgical history.  History reviewed. No pertinent family history.  History  Substance Use Topics  . Smoking status: Never Smoker   . Smokeless tobacco: Not on file  . Alcohol Use: No    OB History   Grav Para Term Preterm Abortions TAB SAB Ect Mult Living   2               Review of Systems  Constitutional: Positive for appetite change. Negative for fever, diaphoresis, fatigue and unexpected weight change.  Respiratory: Negative for cough and shortness of breath.   Cardiovascular: Negative for chest pain.  Gastrointestinal: Positive for abdominal pain. Negative for vomiting, diarrhea, blood in stool, anal bleeding and rectal pain.  Skin:  Negative for rash.  Neurological: Negative for headaches.    Allergies  Review of patient's allergies indicates no known allergies.  Home Medications   Current Outpatient Rx  Name  Route  Sig  Dispense  Refill  . clindamycin (CLEOCIN) 150 MG capsule   Oral   Take 1 capsule (150 mg total) by mouth every 6 (six) hours.   28 capsule   0   . etonogestrel (IMPLANON) 68 MG IMPL implant   Subcutaneous   Inject 1 each into the skin once.         Marland Kitchen omeprazole (PRILOSEC) 2 mg/mL SUSP   Oral   Take 20 mLs (40 mg total) by mouth daily.   120 mL   0   . sucralfate (CARAFATE) 1 G tablet   Oral   Take 1 tablet (1 g total) by mouth at bedtime as needed.   5 tablet   0   . traMADol (ULTRAM) 50 MG tablet   Oral   Take 1 tablet (50 mg total) by mouth every 6 (six) hours as needed for pain.   15 tablet   0     BP 106/67  Pulse 63  Temp(Src) 98.1 F (36.7 C) (Oral)  Resp 17  SpO2 100%  Physical Exam  Nursing note and vitals reviewed. Constitutional: Vital signs are normal. She appears well-developed and well-nourished. She appears toxic. She does not have a sickly appearance.  HENT:  Head: Normocephalic.  Mouth/Throat: Uvula is midline and mucous membranes are normal. Mucous membranes are not dry and not cyanotic. No oropharyngeal exudate, posterior oropharyngeal  edema, posterior oropharyngeal erythema or tonsillar abscesses.  Eyes: Conjunctivae are normal. No scleral icterus.  Pulmonary/Chest: Effort normal.  Abdominal: Soft. She exhibits no distension and no mass. There is tenderness in the epigastric area. There is no rebound and no guarding.    Neurological: She is alert.  Skin: Skin is warm. No rash noted. No erythema.    ED Course  Procedures (including critical care time)  Labs Reviewed - No data to display No results found.   1. Esophagitis   2. Gastritis       MDM  Patient is afebrile coma with a soft abdomen. Current symptoms and exam was most  consistent with gastritis/esophagitis will treat patient with a course of PPI along with supraclavicular be taken at night for the next 5 nights. Have discussed with her husband symptoms that should warrant further evaluation in the emergency department.        Jimmie Molly, MD 12/02/12 1345  Jimmie Molly, MD 12/02/12 1346

## 2012-12-02 NOTE — ED Notes (Signed)
C/o epigastric pain and tongue pain for 2 days states that she is no longer taking medications that were prescribed on June 1 in ER

## 2012-12-02 NOTE — ED Notes (Addendum)
Asked by registration to evaluate patient.  Patient speak limited english.  History obtained from patient's husband.  Patient complains of epigastric pain x 2 days that is worse with eating or drinking.  Patient also complains of pain when breathing.  Patient's vital signs WNL compared to visit on 11/25/2012.  Patient history and vital signs reviewed with Michiel Cowboy RN.  Patient escorted back to waiting room.  Patient's husband made ware to let us know if anything changes

## 2012-12-04 ENCOUNTER — Encounter (HOSPITAL_COMMUNITY): Payer: Self-pay

## 2012-12-04 ENCOUNTER — Emergency Department (HOSPITAL_COMMUNITY)
Admission: EM | Admit: 2012-12-04 | Discharge: 2012-12-04 | Disposition: A | Discharge status: Home / Self Care | Payer: Medicaid Other | Attending: Emergency Medicine | Admitting: Emergency Medicine

## 2012-12-04 ENCOUNTER — Emergency Department (HOSPITAL_COMMUNITY): Payer: Medicaid Other

## 2012-12-04 DIAGNOSIS — R110 Nausea: Secondary | ICD-10-CM | POA: Insufficient documentation

## 2012-12-04 DIAGNOSIS — R1310 Dysphagia, unspecified: Secondary | ICD-10-CM | POA: Insufficient documentation

## 2012-12-04 DIAGNOSIS — R599 Enlarged lymph nodes, unspecified: Secondary | ICD-10-CM | POA: Insufficient documentation

## 2012-12-04 DIAGNOSIS — R630 Anorexia: Secondary | ICD-10-CM | POA: Insufficient documentation

## 2012-12-04 DIAGNOSIS — R509 Fever, unspecified: Secondary | ICD-10-CM | POA: Insufficient documentation

## 2012-12-04 DIAGNOSIS — R1013 Epigastric pain: Secondary | ICD-10-CM | POA: Insufficient documentation

## 2012-12-04 DIAGNOSIS — K1370 Unspecified lesions of oral mucosa: Secondary | ICD-10-CM | POA: Insufficient documentation

## 2012-12-04 DIAGNOSIS — Z3202 Encounter for pregnancy test, result negative: Secondary | ICD-10-CM | POA: Insufficient documentation

## 2012-12-04 DIAGNOSIS — Z79899 Other long term (current) drug therapy: Secondary | ICD-10-CM | POA: Insufficient documentation

## 2012-12-04 DIAGNOSIS — K209 Esophagitis, unspecified without bleeding: Secondary | ICD-10-CM | POA: Insufficient documentation

## 2012-12-04 LAB — CBC
HCT: 35 % — ABNORMAL LOW (ref 36.0–46.0)
Hemoglobin: 12.3 g/dL (ref 12.0–15.0)
MCH: 30.8 pg (ref 26.0–34.0)
MCV: 87.7 fL (ref 78.0–100.0)
Platelets: 126 10*3/uL — ABNORMAL LOW (ref 150–400)
RBC: 3.99 MIL/uL (ref 3.87–5.11)
WBC: 9.2 10*3/uL (ref 4.0–10.5)

## 2012-12-04 LAB — URINALYSIS, ROUTINE W REFLEX MICROSCOPIC
Glucose, UA: NEGATIVE mg/dL
Hgb urine dipstick: NEGATIVE
Ketones, ur: NEGATIVE mg/dL
pH: 6 (ref 5.0–8.0)

## 2012-12-04 LAB — BASIC METABOLIC PANEL
BUN: 10 mg/dL (ref 6–23)
CO2: 25 mEq/L (ref 19–32)
Chloride: 103 mEq/L (ref 96–112)
Creatinine, Ser: 0.55 mg/dL (ref 0.50–1.10)
Glucose, Bld: 124 mg/dL — ABNORMAL HIGH (ref 70–99)

## 2012-12-04 LAB — URINE MICROSCOPIC-ADD ON

## 2012-12-04 MED ORDER — GI COCKTAIL ~~LOC~~
30.0000 mL | Freq: Once | ORAL | Status: AC
  Administered 2012-12-04: 30 mL via ORAL
  Filled 2012-12-04: qty 30

## 2012-12-04 NOTE — ED Notes (Signed)
Pt. C/o chest pain x4 days with radiation to tongue. Reports N/V and difficulty breathing. Was seen here previously for same, states medicine they gave her did not help. Pt. Denies problems with urination or bowel movements.

## 2012-12-04 NOTE — ED Notes (Signed)
Pt. C/o mid epigastric/chest pain starting x4 days ago. Reports pain as severe and radiating to back. Pt. Also has ulcers on tongue. Denies fever but states "I get hot flashes". Pt. Also reports N/V.

## 2012-12-04 NOTE — ED Provider Notes (Signed)
History     CSN: [id number]  Arrival date & time 12/04/12  0544   First MD Initiated Contact with Patient 12/04/12 [phone]      Chief Complaint  Patient presents with  . Chest Pain    (Consider location/radiation/quality/duration/timing/severity/associated sxs/prior treatment) The history is limited by a language barrier. A language interpreter was used.   22 yo female with no PMHx presents to the ER c/o abdominal pain x 3-4 months. Pain is located primarily in the epigastric region & associated w dysphagia, nausea & anorexia. Abdominal pain exacerbated by pressure on the area and breathing. The pain radiates to her back and is tender on the skin.  Her pain has gradually worsened over the past week and that is why she presents to the ED today. She reports tactile fevers, however temperature not taken. In addition she states that she has ulcerations on her tongue that feel as though they extend down her throat making it difficult to eat and drink.  She denies emesis, urinary frequency, hematuria, vaginal discharge, change in bowel movements, localized lower abdominal pain, hematemesis, hematochezia, weight loss. She has had an IUD for the past year and is being followed by a GYN. No sick contacts. Traveled to the [place] 1 year ago from [place]. No alcohol use, married and sexually active.   Medical chart reviewed and Pt has been evaluated for similar presentation 7 times in the last 6 months.  Most recently she was seen 2 days ago by urgent care and discharged on liquid PPI and Carafate pills.  It appears a previous provider has typos in chart and based on other notes.   History reviewed. No pertinent past medical history.  History reviewed. No pertinent past surgical history.  No family history on file.  History  Substance Use Topics  . Smoking status: Never Smoker   . Smokeless tobacco: Not on file  . Alcohol Use: No    OB History   Grav Para Term Preterm Abortions TAB SAB Ect Mult Living    2               Review of Systems  All other systems reviewed and are negative.   No vaginal discharge or bleeding, no dysuria, no   Allergies  Review of patient's allergies indicates no known allergies.  Home Medications   Current Outpatient Rx  Name  Route  Sig  Dispense  Refill  . clindamycin (CLEOCIN) 150 MG capsule   Oral   Take 1 capsule (150 mg total) by mouth every 6 (six) hours.   28 capsule   0   . omeprazole (PRILOSEC) 2 mg/mL SUSP   Oral   Take 20 mLs (40 mg total) by mouth daily.   120 mL   0   . sucralfate (CARAFATE) 1 G tablet   Oral   Take 1 tablet (1 g total) by mouth at bedtime as needed.   5 tablet   0   . traMADol (ULTRAM) 50 MG tablet   Oral   Take 1 tablet (50 mg total) by mouth every 6 (six) hours as needed for pain.   15 tablet   0   . etonogestrel (IMPLANON) 68 MG IMPL implant   Subcutaneous   Inject 1 each into the skin once.           BP 101/55  Pulse 62  Temp(Src) 98.1 F (36.7 C) (Oral)  Resp 20  SpO2 98%  Physical Exam  Nursing note and vitals  reviewed. Constitutional: She is oriented to person, place, and time. She appears well-developed and well-nourished. She appears distressed.  HENT:  Head: Normocephalic and atraumatic.  Throat lesions, possibly blisters, but no ulcerations seen.  Uvula midline, no tonsillar or oropharyngeal exudate   Eyes: Conjunctivae and EOM are normal.  Neck: Normal range of motion.  Full range of motion.  No posterior auricular lymphadenopathy  Cardiovascular:  Regular rate rhythm.  Intact distal pulses.  Pulmonary/Chest: Effort normal.  Lungs clear auscultation bilaterally  Abdominal:  Normal bowel sounds.  Epigastric abdominal pain without any peritoneal signs.  Musculoskeletal: Normal range of motion.  Lymphadenopathy:    She has cervical adenopathy.  Neurological: She is alert and oriented to person, place, and time.  Skin: Skin is warm and dry. No rash noted. She is not  diaphoretic.  Psychiatric: She has a normal mood and affect. Her behavior is normal.      ED Course  Procedures (including critical care time)  Labs Reviewed  CBC - Abnormal; Notable for the following:    HCT 35.0 (*)    Platelets 126 (*)    All other components within normal limits  BASIC METABOLIC PANEL - Abnormal; Notable for the following:    Glucose, Bld 124 (*)    All other components within normal limits  URINALYSIS, ROUTINE W REFLEX MICROSCOPIC - Abnormal; Notable for the following:    Leukocytes, UA TRACE (*)    All other components within normal limits  URINE MICROSCOPIC-ADD ON - Abnormal; Notable for the following:    Squamous Epithelial / LPF FEW (*)    All other components within normal limits  POCT PREGNANCY, URINE   Dg Abd Acute W/chest  12/04/2012   *RADIOLOGY REPORT*  Clinical Data: Mid chest pain, epigastric pain  ACUTE ABDOMEN SERIES (ABDOMEN 2 VIEW & CHEST 1 VIEW)  Comparison: Chest x-ray of 10/21/2012  Findings: No active infiltrate or effusion is seen.  Mediastinal contours appear normal.  The heart is within normal limits in size. No bony abnormality is seen.  Supine and erect views of the abdomen show a moderate amount of feces throughout the colon.  No bowel obstruction is seen.  No free intraperitoneal air is noted.  No opaque calculi are seen.  IMPRESSION:  1.  No active lung disease. 2.  No bowel obstruction and no free air.  Moderate amount of feces throughout the colon.   Original Report Authenticated By: Dwyane Dee, M.D.   CT soft tissue neck, 11/25/2012 IMPRESSION:  Mild tonsillar enlargement may reflect inflammation. No  peritonsillar abscess.  Consult GI: Dr. Ewing Schlein, Requests pt be kept NPO after midnight, return to [facility] at 8:15 for scheduled endoscopy at 9:15. Explained to pt with interpreter. She is aware that she will HAVE to have an english speaker with her tomorrow.   1. Esophagitis     BP 101/55  Pulse 62  Temp(Src) 98.1 F (36.7 C)  (Oral)  Resp 20  SpO2 55%   MDM  22 year old female to emergency department complaining of emesis, epigastric pain, and dysphasia stating that she feels she has ulcerations in her throat.  Patient was started on suspension PPI and tablet Carafate 2 days ago by urgent care. Pt advised to not take carfate before endoscopy.  Patient has been explained in depth using translator that she needs an endoscopy and followup with a gastroenterologist as there is no further studies that can be done for her in the emergency department.  Strict return precautions discussed  for any signs of worsening infection or perforation.  Patient and mother verbalized understanding.         Jaci Carrel, [place] 12/04/12 1023

## 2012-12-04 NOTE — ED Provider Notes (Signed)
Medical screening examination/treatment/procedure(s) were performed by non-physician practitioner and as supervising physician I was immediately available for consultation/collaboration.   Carleene Cooper III, MD 12/04/12 [phone]

## 2012-12-04 NOTE — ED Notes (Signed)
Pt discharged.Vital signs stable and GCS 15.Discharge and follow up appointment explained to pt by interpretor.

## 2012-12-05 ENCOUNTER — Encounter (HOSPITAL_COMMUNITY): Payer: Self-pay | Admitting: Gastroenterology

## 2012-12-05 ENCOUNTER — Ambulatory Visit (HOSPITAL_COMMUNITY)
Admission: RE | Admit: 2012-12-05 | Discharge: 2012-12-05 | Disposition: A | Discharge status: Home / Self Care | Payer: Medicaid Other | Source: Ambulatory Visit | Attending: Gastroenterology | Admitting: Gastroenterology

## 2012-12-05 ENCOUNTER — Encounter (HOSPITAL_COMMUNITY)
Admission: RE | Disposition: A | Discharge status: Home / Self Care | Payer: Self-pay | Source: Ambulatory Visit | Attending: Gastroenterology

## 2012-12-05 DIAGNOSIS — K219 Gastro-esophageal reflux disease without esophagitis: Secondary | ICD-10-CM | POA: Insufficient documentation

## 2012-12-05 DIAGNOSIS — K2210 Ulcer of esophagus without bleeding: Secondary | ICD-10-CM | POA: Insufficient documentation

## 2012-12-05 DIAGNOSIS — R1310 Dysphagia, unspecified: Secondary | ICD-10-CM | POA: Insufficient documentation

## 2012-12-05 HISTORY — DX: Gastro-esophageal reflux disease without esophagitis: K21.9

## 2012-12-05 HISTORY — PX: ESOPHAGOGASTRODUODENOSCOPY: SHX5428

## 2012-12-05 SURGERY — EGD (ESOPHAGOGASTRODUODENOSCOPY)
Anesthesia: Moderate Sedation

## 2012-12-05 MED ORDER — SODIUM CHLORIDE 0.9 % IV SOLN
INTRAVENOUS | Status: DC
  Administered 2012-12-05: 10:00:00 via INTRAVENOUS

## 2012-12-05 MED ORDER — MIDAZOLAM HCL 5 MG/ML IJ SOLN
INTRAMUSCULAR | Status: AC
  Filled 2012-12-05: qty 2

## 2012-12-05 MED ORDER — FENTANYL CITRATE 0.05 MG/ML IJ SOLN
INTRAMUSCULAR | Status: DC | PRN
  Administered 2012-12-05 (×3): 25 ug via INTRAVENOUS

## 2012-12-05 MED ORDER — FENTANYL CITRATE 0.05 MG/ML IJ SOLN
INTRAMUSCULAR | Status: AC
  Filled 2012-12-05: qty 2

## 2012-12-05 MED ORDER — DIPHENHYDRAMINE HCL 50 MG/ML IJ SOLN
INTRAMUSCULAR | Status: AC
  Filled 2012-12-05: qty 1

## 2012-12-05 MED ORDER — MIDAZOLAM HCL 10 MG/2ML IJ SOLN
INTRAMUSCULAR | Status: DC | PRN
  Administered 2012-12-05 (×4): 2 mg via INTRAVENOUS

## 2012-12-05 MED ORDER — BUTAMBEN-TETRACAINE-BENZOCAINE 2-2-14 % EX AERO
INHALATION_SPRAY | CUTANEOUS | Status: DC | PRN
  Administered 2012-12-05 (×2): 1 via TOPICAL

## 2012-12-05 NOTE — Consult Note (Signed)
Reason for Consult: Swallowing problem Referring Physician: ER physician Kayla Bender is an 22 y.o. female.  HPI: Patient seen and examined and discussed case with ER physician yesterday and today with the patient via interpreter and she's had no previous GI or swallowing problems and history is difficult due to language problems but it sounds like she was first diagnosed 2 weeks ago with a tonsil problem and given antibiotics and then her swallowing problem started and she's been to the emergency room multiple times and CT x-ray and labs have been nonrevealing and her family history is negative for any swallowing issues and she has no other complaints   Past Medical History  Diagnosis Date  . GERD (gastroesophageal reflux disease)     Past Surgical History  Procedure Laterality Date  . Cesarean section      History reviewed. No pertinent family history.  Social History:  reports that she has never smoked. She does not have any smokeless tobacco history on file. She reports that she does not drink alcohol or use illicit drugs.  Allergies: No Known Allergies  Medications: I have reviewed the patient's current medications.  Results for orders placed during the hospital encounter of 12/04/12 (from the past 48 hour(s))  CBC     Status: Abnormal   Collection Time    12/04/12  6:40 AM      Result Value Range   WBC 9.2  4.0 - 10.5 K/uL   RBC 3.99  3.87 - 5.11 MIL/uL   Hemoglobin 12.3  12.0 - 15.0 g/dL   HCT 16.1 (*) 09.6 - 04.5 %   MCV 87.7  78.0 - 100.0 fL   MCH 30.8  26.0 - 34.0 pg   MCHC 35.1  30.0 - 36.0 g/dL   RDW 40.9  81.1 - 91.4 %   Platelets 126 (*) 150 - 400 K/uL  BASIC METABOLIC PANEL     Status: Abnormal   Collection Time    12/04/12  6:40 AM      Result Value Range   Sodium 138  135 - 145 mEq/L   Potassium 4.0  3.5 - 5.1 mEq/L   Comment: HEMOLYSIS AT THIS LEVEL MAY AFFECT RESULT   Chloride 103  96 - 112 mEq/L   CO2 25  19 - 32 mEq/L   Glucose, Bld 124 (*) 70 - 99  mg/dL   BUN 10  6 - 23 mg/dL   Creatinine, Ser 7.82  0.50 - 1.10 mg/dL   Calcium 9.5  8.4 - 95.6 mg/dL   GFR calc non Af Amer >90  >90 mL/min   GFR calc Af Amer >90  >90 mL/min   Comment:            The eGFR has been calculated     using the CKD EPI equation.     This calculation has not been     validated in all clinical     situations.     eGFR's persistently     <90 mL/min signify     possible Chronic Kidney Disease.  URINALYSIS, ROUTINE W REFLEX MICROSCOPIC     Status: Abnormal   Collection Time    12/04/12  7:47 AM      Result Value Range   Color, Urine YELLOW  YELLOW   APPearance CLEAR  CLEAR   Specific Gravity, Urine 1.007  1.005 - 1.030   pH 6.0  5.0 - 8.0   Glucose, UA NEGATIVE  NEGATIVE mg/dL   Hgb urine  dipstick NEGATIVE  NEGATIVE   Bilirubin Urine NEGATIVE  NEGATIVE   Ketones, ur NEGATIVE  NEGATIVE mg/dL   Protein, ur NEGATIVE  NEGATIVE mg/dL   Urobilinogen, UA 0.2  0.0 - 1.0 mg/dL   Nitrite NEGATIVE  NEGATIVE   Leukocytes, UA TRACE (*) NEGATIVE  URINE MICROSCOPIC-ADD ON     Status: Abnormal   Collection Time    12/04/12  7:47 AM      Result Value Range   Squamous Epithelial / LPF FEW (*) RARE   WBC, UA 0-2  <3 WBC/hpf   Bacteria, UA RARE  RARE  POCT PREGNANCY, URINE     Status: None   Collection Time    12/04/12  7:56 AM      Result Value Range   Preg Test, Ur NEGATIVE  NEGATIVE   Comment:            THE SENSITIVITY OF THIS     METHODOLOGY IS >24 mIU/mL    Dg Abd Acute W/chest  12/04/2012   *RADIOLOGY REPORT*  Clinical Data: Mid chest pain, epigastric pain  ACUTE ABDOMEN SERIES (ABDOMEN 2 VIEW & CHEST 1 VIEW)  Comparison: Chest x-ray of 10/21/2012  Findings: No active infiltrate or effusion is seen.  Mediastinal contours appear normal.  The heart is within normal limits in size. No bony abnormality is seen.  Supine and erect views of the abdomen show a moderate amount of feces throughout the colon.  No bowel obstruction is seen.  No free intraperitoneal  air is noted.  No opaque calculi are seen.  IMPRESSION:  1.  No active lung disease. 2.  No bowel obstruction and no free air.  Moderate amount of feces throughout the colon.   Original Report Authenticated By: Dwyane Dee, M.D.    ROS negative except above Blood pressure 92/57, pulse 72, temperature 97.9 F (36.6 C), temperature source Oral, resp. rate 39, height 5' (1.524 m), weight 45 kg (99 lb 3.3 oz), SpO2 100.00%. Physical Exam vital signs stable afebrile no acute distress lungs are clear heart regular rate and rhythm abdomen is soft nontender  Assessment/Plan: Swallowing problems probably secondary to pill-induced ulcer Plan: The risks benefits methods of endoscopy was discussed with her brother the translator and will proceed this morning with further workup and plans pending those findings  Kayla Bender E 12/05/2012, 10:12 AM

## 2012-12-05 NOTE — Op Note (Signed)
[facility] [address] [place], [address]   ENDOSCOPY PROCEDURE REPORT  PATIENT: Kayla Bender, Kayla Bender  MR#: [id number] BIRTHDATE: 1990/07/14 , 21  yrs. old GENDER: Female  ENDOSCOPIST: Vida Rigger, MD REFERRED BY:  PROCEDURE DATE:  12/05/2012 PROCEDURE:   EGD w/ biopsy ASA CLASS:   Class I INDICATIONS:Dysphagia.  MEDICATIONS: Fentanyl 75 mcg IV and Versed 8 mg IV  TOPICAL ANESTHETIC:used  DESCRIPTION OF PROCEDURE:   After the risks benefits and alternatives of the procedure were thoroughly explained, informed consent was obtained.  The PENTAX GASTOROSCOPE [id number]  endoscope was introduced through the mouth and advanced to the second portion of the duodenum , limited by Without limitations.   The instrument was slowly withdrawn as the mucosa was fully examined.the findings are recorded below and the patient tolerated the procedure well there was no obvious immediate complication         FINDINGS:1. Probable pill-induced midesophageal ulcers status post biopsy and brushing 2. Otherwise within normal limits EGD COMPLICATIONS:none  ENDOSCOPIC IMPRESSION:above   RECOMMENDATIONS:liquids only until better. we will try Carafate slurry 4 times a dayand viscous lidocaine 3 or 4 times a day prior to meals. And await biopsy and brushing to rule out infectious causes   REPEAT EXAM: as needed   _______________________________ Vida Rigger, MD eSigned:  Vida Rigger, MD 12/05/2012 10:43 AM    CC:  PATIENT NAME:  Emmory, Solivan MR#: [id number]

## 2012-12-06 ENCOUNTER — Encounter (HOSPITAL_COMMUNITY): Payer: Self-pay | Admitting: Gastroenterology

## 2012-12-17 LAB — VIRAL CULTURE VIRC

## 2013-02-16 ENCOUNTER — Encounter (HOSPITAL_COMMUNITY): Payer: Self-pay | Admitting: Emergency Medicine

## 2013-02-16 ENCOUNTER — Emergency Department (INDEPENDENT_AMBULATORY_CARE_PROVIDER_SITE_OTHER): Payer: Medicaid Other

## 2013-02-16 ENCOUNTER — Emergency Department (INDEPENDENT_AMBULATORY_CARE_PROVIDER_SITE_OTHER)
Admission: EM | Admit: 2013-02-16 | Discharge: 2013-02-16 | Disposition: A | Discharge status: Home / Self Care | Payer: Medicaid Other | Source: Home / Self Care

## 2013-02-16 DIAGNOSIS — J189 Pneumonia, unspecified organism: Secondary | ICD-10-CM

## 2013-02-16 LAB — COMPREHENSIVE METABOLIC PANEL
ALT: 19 U/L (ref 0–35)
AST: 20 U/L (ref 0–37)
CO2: 27 mEq/L (ref 19–32)
Chloride: 101 mEq/L (ref 96–112)
Creatinine, Ser: 0.83 mg/dL (ref 0.50–1.10)
GFR calc non Af Amer: >90 mL/min (ref >90)
Sodium: 138 mEq/L (ref 135–145)
Total Bilirubin: 0.5 mg/dL (ref 0.3–1.2)

## 2013-02-16 LAB — CBC
HCT: 35.4 % — ABNORMAL LOW (ref 36.0–46.0)
MCH: 31 pg (ref 26.0–34.0)
MCHC: 35.6 g/dL (ref 30.0–36.0)
MCV: 87.2 fL (ref 78.0–100.0)
RDW: 12.7 % (ref 11.5–15.5)

## 2013-02-16 MED ORDER — LEVOFLOXACIN 500 MG PO TABS: 500.0000 mg | ORAL_TABLET | Freq: Every day | ORAL | Status: DC

## 2013-02-16 NOTE — ED Notes (Addendum)
Via [place] interpreter.... Pt c/o dizziness onset 1.5 months Sxs also include: decreased appetite and weakness Denies: fevers. Alert w/no signs of acute distress.

## 2013-02-16 NOTE — ED Provider Notes (Signed)
Kayla Bender is a 22 y.o. female who presents to Urgent Care today for dizziness, lightheadedness, pleuritic chest pain. These symptoms have been ongoing for some time. They have worsened recently. She denies any shortness of breath or palpitations. Additionally she notes the dizziness feels as though she will pass out when she stands. She denies any vertigo. The pain is felt in the left chest with inspiration. The pain is not worse with exertion. She has not tried any medications for this problem. She's tried but been unable to schedule an appointment with her primary care provider at [facility]. No nausea vomiting diarrhea fever chills or night sweats.    PMH reviewed. Healthy otherwise History  Substance Use Topics  . Smoking status: Never Smoker   . Smokeless tobacco: Not on file  . Alcohol Use: No   ROS as above Medications reviewed. No current facility-administered medications for this encounter.   Current Outpatient Prescriptions  Medication Sig Dispense Refill  . etonogestrel (IMPLANON) 68 MG IMPL implant Inject 1 each into the skin once.      Marland Kitchen levofloxacin (LEVAQUIN) 500 MG tablet Take 1 tablet (500 mg total) by mouth daily.  7 tablet  0  . omeprazole (PRILOSEC) 2 mg/mL SUSP Take 20 mLs (40 mg total) by mouth daily.  120 mL  0  . traMADol (ULTRAM) 50 MG tablet Take 1 tablet (50 mg total) by mouth every 6 (six) hours as needed for pain.  15 tablet  0    Exam:  BP 100/63  Pulse 78  Temp(Src) 97 F (36.1 C) (Oral)  Resp 18  SpO2 98% Filed Vitals:   02/16/13 1648 02/16/13 1721 02/16/13 1723 02/16/13 1724  BP: 87/56 87/52 93/62  100/63  Pulse: 74 61 65 78  Temp: 97 F (36.1 C)     TempSrc: Oral     Resp: 18     SpO2: 98%       Gen: Well NAD HEENT: EOMI,  MMM Lungs: CTABL Nl WOB Heart: RRR no MRG Abd: NABS, NT, ND Exts: Non edematous BL  LE, warm and well perfused.  Psych: Flat affect Results for orders placed during the hospital encounter of 02/16/13 (from  the past 24 hour(s))  COMPREHENSIVE METABOLIC PANEL     Status: None   Collection Time    02/16/13  5:25 PM      Result Value Range   Sodium 138  135 - 145 mEq/L   Potassium 3.6  3.5 - 5.1 mEq/L   Chloride 101  96 - 112 mEq/L   CO2 27  19 - 32 mEq/L   Glucose, Bld 90  70 - 99 mg/dL   BUN 13  6 - 23 mg/dL   Creatinine, Ser 4.54  0.50 - 1.10 mg/dL   Calcium 9.6  8.4 - 09.8 mg/dL   Total Protein 7.4  6.0 - 8.3 g/dL   Albumin 4.1  3.5 - 5.2 g/dL   AST 20  0 - 37 U/L   ALT 19  0 - 35 U/L   Alkaline Phosphatase 60  39 - 117 U/L   Total Bilirubin 0.5  0.3 - 1.2 mg/dL   GFR calc non Af Amer >90  >90 mL/min   GFR calc Af Amer >90  >90 mL/min  CBC     Status: Abnormal   Collection Time    02/16/13  5:25 PM      Result Value Range   WBC 7.8  4.0 - 10.5 K/uL  RBC 4.06  3.87 - 5.11 MIL/uL   Hemoglobin 12.6  12.0 - 15.0 g/dL   HCT 16.1 (*) 09.6 - 04.5 %   MCV 87.2  78.0 - 100.0 fL   MCH 31.0  26.0 - 34.0 pg   MCHC 35.6  30.0 - 36.0 g/dL   RDW 40.9  81.1 - 91.4 %   Platelets 127 (*) 150 - 400 K/uL   Dg Chest 2 View  02/16/2013   *RADIOLOGY REPORT*  Clinical Data: Inspiratory chest pain.  CHEST - 2 VIEW  Comparison: One-view chest x-ray 12/04/2012.  Two-view chest x-ray 10/21/2012, 10/17/2012.  Findings: Cardiomediastinal silhouette unremarkable, unchanged. Patchy airspace opacities in the left lower lobe, new since prior examinations.  Lungs remain clear otherwise.  No pleural effusions. Visualized bony thorax intact.  IMPRESSION: Patchy bronchopneumonia suspected in the left lower lobe.   Original Report Authenticated By: Hulan Saas, M.D.   Twelve-lead EKG shows normal sinus rhythm at 60 beats per minute. Normal EKG  Assessment and Plan: 22 y.o. female with pleuritic chest pain with bronchial pneumonia.  Plan to treat with levofloxacin.   Review of chart shows dizziness lightheadedness and hypotension seemed to be a persistent problem. I am unsure of this etiology. Her  comprehensive metabolic panel and hemoglobin appear to be normal.   Additionally TSH and free T4 pending.  She is not orthostatic today.  Plan to followup with primary care provider for further evaluation and management of this problem.   An interpreter services used for this visit.  Discussed warning signs or symptoms. Please see discharge instructions. Patient expresses understanding.      Rodolph Bong, MD 02/16/13 [phone]

## 2013-02-21 ENCOUNTER — Emergency Department (HOSPITAL_COMMUNITY)
Admission: EM | Admit: 2013-02-21 | Discharge: 2013-02-22 | Disposition: A | Discharge status: Home / Self Care | Payer: Medicaid Other | Attending: Emergency Medicine | Admitting: Emergency Medicine

## 2013-02-21 ENCOUNTER — Encounter (HOSPITAL_COMMUNITY): Payer: Self-pay | Admitting: Emergency Medicine

## 2013-02-21 DIAGNOSIS — K208 Other esophagitis without bleeding: Secondary | ICD-10-CM | POA: Insufficient documentation

## 2013-02-21 DIAGNOSIS — T50905A Adverse effect of unspecified drugs, medicaments and biological substances, initial encounter: Secondary | ICD-10-CM

## 2013-02-21 DIAGNOSIS — R109 Unspecified abdominal pain: Secondary | ICD-10-CM

## 2013-02-21 DIAGNOSIS — R21 Rash and other nonspecific skin eruption: Secondary | ICD-10-CM | POA: Insufficient documentation

## 2013-02-21 DIAGNOSIS — Z3202 Encounter for pregnancy test, result negative: Secondary | ICD-10-CM | POA: Insufficient documentation

## 2013-02-21 DIAGNOSIS — R070 Pain in throat: Secondary | ICD-10-CM | POA: Insufficient documentation

## 2013-02-21 DIAGNOSIS — Z79899 Other long term (current) drug therapy: Secondary | ICD-10-CM | POA: Insufficient documentation

## 2013-02-21 DIAGNOSIS — R5381 Other malaise: Secondary | ICD-10-CM | POA: Insufficient documentation

## 2013-02-21 DIAGNOSIS — R42 Dizziness and giddiness: Secondary | ICD-10-CM | POA: Insufficient documentation

## 2013-02-21 DIAGNOSIS — R5383 Other fatigue: Secondary | ICD-10-CM

## 2013-02-21 DIAGNOSIS — K219 Gastro-esophageal reflux disease without esophagitis: Secondary | ICD-10-CM | POA: Insufficient documentation

## 2013-02-21 LAB — COMPREHENSIVE METABOLIC PANEL
ALT: 16 U/L (ref 0–35)
AST: 19 U/L (ref 0–37)
CO2: 26 mEq/L (ref 19–32)
Chloride: 104 mEq/L (ref 96–112)
Creatinine, Ser: 1.11 mg/dL — ABNORMAL HIGH (ref 0.50–1.10)
GFR calc Af Amer: 82 mL/min — ABNORMAL LOW (ref >90)
GFR calc non Af Amer: 71 mL/min — ABNORMAL LOW (ref >90)
Glucose, Bld: 90 mg/dL (ref 70–99)
Total Bilirubin: 0.6 mg/dL (ref 0.3–1.2)

## 2013-02-21 LAB — URINALYSIS, ROUTINE W REFLEX MICROSCOPIC
Glucose, UA: NEGATIVE mg/dL
Hgb urine dipstick: NEGATIVE
Ketones, ur: NEGATIVE mg/dL
Protein, ur: NEGATIVE mg/dL
Urobilinogen, UA: 0.2 mg/dL (ref 0.0–1.0)

## 2013-02-21 LAB — CBC WITH DIFFERENTIAL/PLATELET
Basophils Absolute: 0 10*3/uL (ref 0.0–0.1)
Eosinophils Relative: 1 % (ref 0–5)
Lymphocytes Relative: 44 % (ref 12–46)
Lymphs Abs: 3.5 10*3/uL (ref 0.7–4.0)
MCH: 29.9 pg (ref 26.0–34.0)
MCHC: 34.1 g/dL (ref 30.0–36.0)
Monocytes Absolute: 0.5 10*3/uL (ref 0.1–1.0)
Neutro Abs: 3.9 10*3/uL (ref 1.7–7.7)
Platelets: 143 10*3/uL — ABNORMAL LOW (ref 150–400)

## 2013-02-21 NOTE — ED Notes (Addendum)
PT. ARRIVED WITH EMS FROM HOME REPORTS GENERALIZED ABDOMINAL PAIN WITH POOR APPETITE , GENERALIZED WEAKNESS / MALAISE , SEEN AT [facility] 02/16/2013 DIAGNOSED WITH PNEUMONIA PRESCRIBED WITH ORAL ANTIBIOTIC . RESPIRATIONS UNLABORED / NO COUGH OR CONGESTION . NO FEVER OR CHILLS. AUNT TRANSLATING ( NE[place]ALESE) FOR PT.

## 2013-02-22 MED ORDER — ESOMEPRAZOLE MAGNESIUM 40 MG PO CPDR: 40.0000 mg | DELAYED_RELEASE_CAPSULE | Freq: Every day | ORAL | Status: DC

## 2013-02-22 MED ORDER — SUCRALFATE 1 GM/10ML PO SUSP: 1.0000 g | Freq: Four times a day (QID) | ORAL | Status: DC

## 2013-02-22 NOTE — ED Notes (Signed)
In with md with interpreter phone for assessment of pt.  Pt sts symptoms have been going on x 1 wk. Throat sore, abd pain, and chest feeling weird.  She was seen here last wk and diag with pneumonia.  sts worse since here last.

## 2013-02-22 NOTE — ED Provider Notes (Signed)
CSN: [id number]     Arrival date & time 02/21/13  1950 History   First MD Initiated Contact with Patient 02/22/13 0009     Chief Complaint  Patient presents with  . Abdominal Pain   (Consider location/radiation/quality/duration/timing/severity/associated sxs/prior Treatment) HPI 22 yo female presents to the ER from home with multiple complaints.  She speaks [place], and phone interpreter used for history and physical.  Pt is a very poor historian despite use of the interpreter.  She reports she was seen at urgent care last week due to ongoing abdominal pain, weakness, near syncope.  She was given medication for her liver.  Since taking the medication, however, she now has throat pain, decreased appetite, and worsening abdominal pain into her heart.  Pt was seen at urgent care and dx with pneumonia, given Levaquin.  By Dr Zollie Pee note, and other similar notes, pt's sxs have been ongoing for several months to years.  She denies having h/o GERD or any stomach/esophageal problems or being on similar medications, but had EGD done this past June with possible pill esophagitis.  Pt reports she has no energy, only wants to sleep all the time and not go to work.  She has been unable to establish care with a pcm due to her lack of english.  No fevers, no bowel problems, no vaginal discharge.  She reports normal periods.  Dizziness worse with walking, standing.  No syncope, but does feel at times she may pass out.  Reports she eats normally, but has a hard time listing what a normal day of eating entails.  Pt is also concerned about a black rash to her left clavicle, ongoing for the last 5 months.  No pain or itching of the area, no trauma reported  Past Medical History  Diagnosis Date  . GERD (gastroesophageal reflux disease)    Past Surgical History  Procedure Laterality Date  . Cesarean section    . Esophagogastroduodenoscopy N/A 12/05/2012    Procedure: ESOPHAGOGASTRODUODENOSCOPY (EGD);  Surgeon: Petra Kuba, MD;  Location: [facility] ENDOSCOPY;  Service: Endoscopy;  Laterality: N/A;   No family history on file. History  Substance Use Topics  . Smoking status: Never Smoker   . Smokeless tobacco: Not on file  . Alcohol Use: No   OB History   Grav Para Term Preterm Abortions TAB SAB Ect Mult Living   2              Review of Systems  All other systems reviewed and are negative.    Allergies  Review of patient's allergies indicates no known allergies.  Home Medications   Current Outpatient Rx  Name  Route  Sig  Dispense  Refill  . etonogestrel (IMPLANON) 68 MG IMPL implant   Subcutaneous   Inject 1 each into the skin once.         Marland Kitchen levofloxacin (LEVAQUIN) 750 MG tablet   Oral   Take 750 mg by mouth daily. For 7 days; Start date 02/16/13         . traMADol (ULTRAM) 50 MG tablet   Oral   Take 1 tablet (50 mg total) by mouth every 6 (six) hours as needed for pain.   15 tablet   0   . esomeprazole (NEXIUM) 40 MG capsule   Oral   Take 1 capsule (40 mg total) by mouth daily. Open capsule and put powder in applesauce or pudding.  Take one capsule contents daily   30 capsule  0   . sucralfate (CARAFATE) 1 GM/10ML suspension   Oral   Take 10 mLs (1 g total) by mouth 4 (four) times daily.   420 mL   0    BP 105/70  Pulse 78  Temp(Src) 97.6 F (36.4 C) (Oral)  Resp 16  SpO2 100% Physical Exam  Nursing note and vitals reviewed. Constitutional: She is oriented to person, place, and time. She appears well-developed and well-nourished.  Thin female, NAD, speaks rapidly with interpreter, keeps eyes closed  HENT:  Head: Normocephalic and atraumatic.  Right Ear: External ear normal.  Left Ear: External ear normal.  Nose: Nose normal.  Mouth/Throat: Oropharynx is clear and moist.  Eyes: Conjunctivae and EOM are normal. Pupils are equal, round, and reactive to light.  Neck: Normal range of motion. Neck supple. No JVD present. No tracheal deviation present. No thyromegaly  present.  Cardiovascular: Normal rate, regular rhythm, normal heart sounds and intact distal pulses.  Exam reveals no gallop and no friction rub.   No murmur heard. Pulmonary/Chest: Effort normal and breath sounds normal. No stridor. No respiratory distress. She has no wheezes. She has no rales. She exhibits no tenderness.  Abdominal: Soft. Bowel sounds are normal. She exhibits no distension and no mass. There is tenderness (mild diffuse tenderness, worse in epigastrium). There is no rebound and no guarding.  Musculoskeletal: Normal range of motion. She exhibits no edema and no tenderness.  Lymphadenopathy:    She has no cervical adenopathy.  Neurological: She is alert and oriented to person, place, and time. She exhibits normal muscle tone. Coordination normal.  Skin: Skin is warm and dry. No rash noted. No erythema. No pallor.  Area of darkened skin over left clavicle, almost like ecchymosis.  Skin reddened over the area where patient frequently is rubbing and scratching the area.   Psychiatric: Her behavior is normal. Judgment and thought content normal.  Flat affect    ED Course  Procedures (including critical care time) Labs Review Labs Reviewed  CBC WITH DIFFERENTIAL - Abnormal; Notable for the following:    HCT 35.5 (*)    Platelets 143 (*)    All other components within normal limits  COMPREHENSIVE METABOLIC PANEL - Abnormal; Notable for the following:    Creatinine, Ser 1.11 (*)    GFR calc non Af Amer 71 (*)    GFR calc Af Amer 82 (*)    All other components within normal limits  LIPASE, BLOOD  URINALYSIS, ROUTINE W REFLEX MICROSCOPIC  POCT PREGNANCY, URINE   Imaging Review No results found.  MDM   1. Pill esophagitis   2. Abdominal pain   3. Fatigue    22 yo female with multiple complaints.  I have reassured here that her labs do not show any acute abnormalities.  Her worsening abd/epigastric and throat pain may be due to some gastritis/esophagitis from recent  levaquin.  Will place back on ppi and carafate.  I wonder if she has some element of depression-this is difficult to draw out using the phone interpreter.  Will refer her to wellness center and /or [facility]    Olivia Mackie, MD 02/22/13 [phone]

## 2013-02-22 NOTE — ED Notes (Signed)
Pt dc to home. Pt sts understanding to dc instructions. Pt ambulatory to exit without difficulty.  Pt denies need for w/c.  

## 2013-07-26 IMAGING — CR DG ABDOMEN ACUTE W/ 1V CHEST
3 series · 3 of 3 positions shown · non-contrast
Comparison: Chest x-ray of 10/21/2012

CLINICAL DATA: Mid chest pain, epigastric pain

ACUTE ABDOMEN SERIES (ABDOMEN 2 VIEW & CHEST 1 VIEW)

[w chest pa]
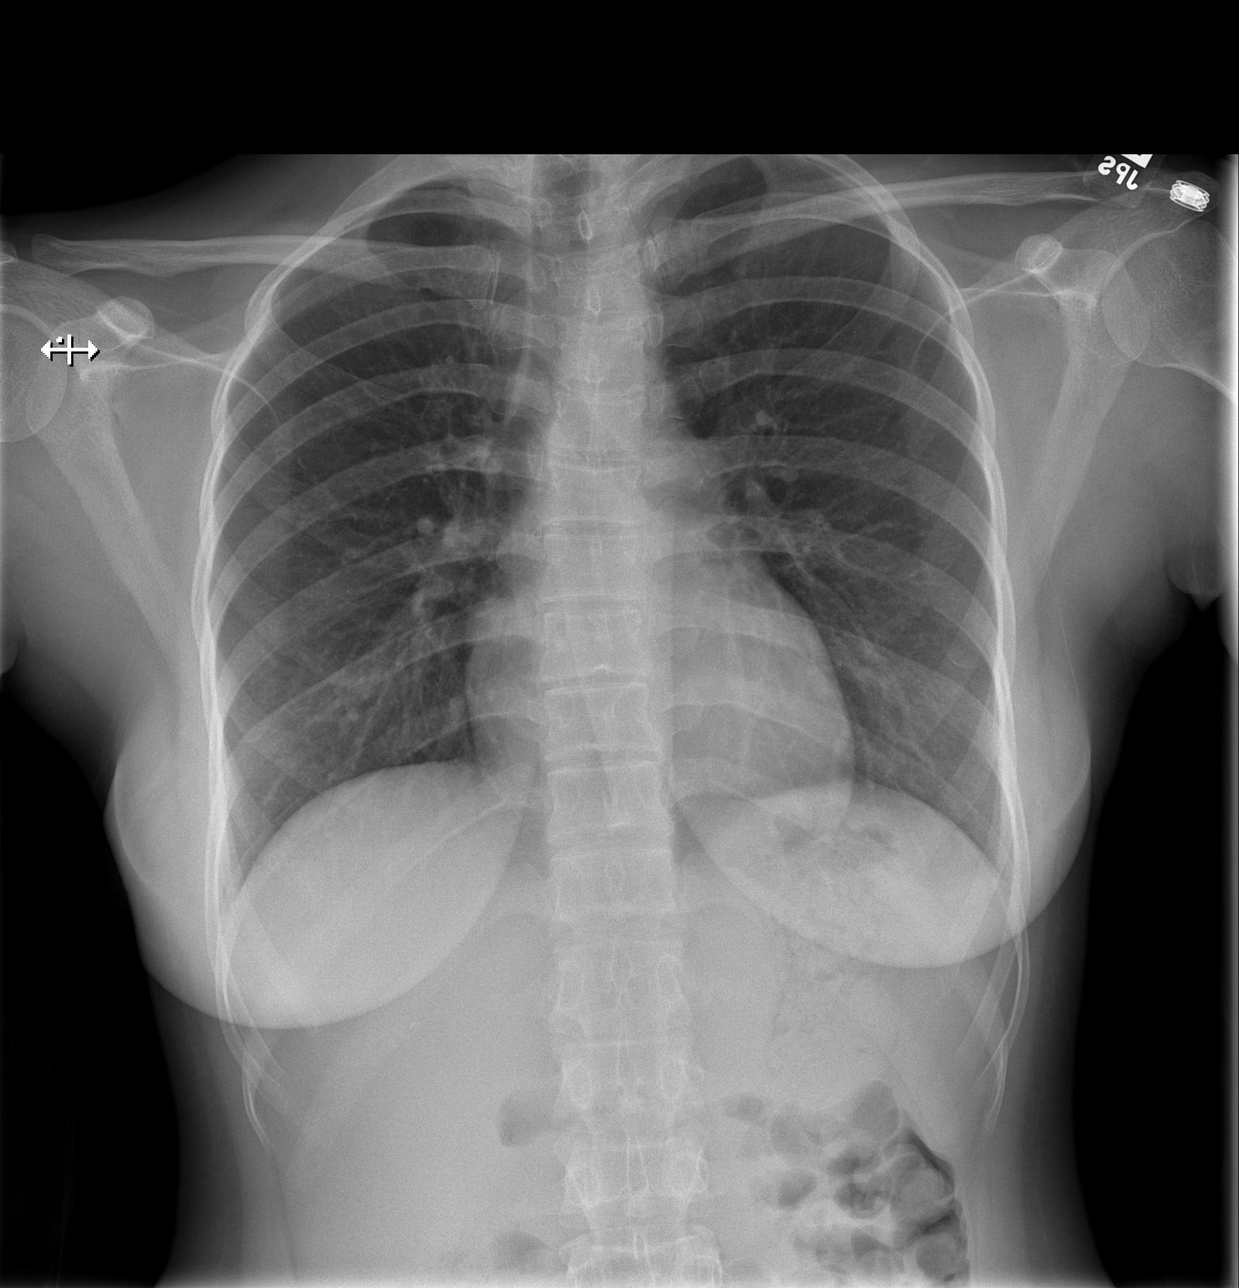

[w abdomen upright]
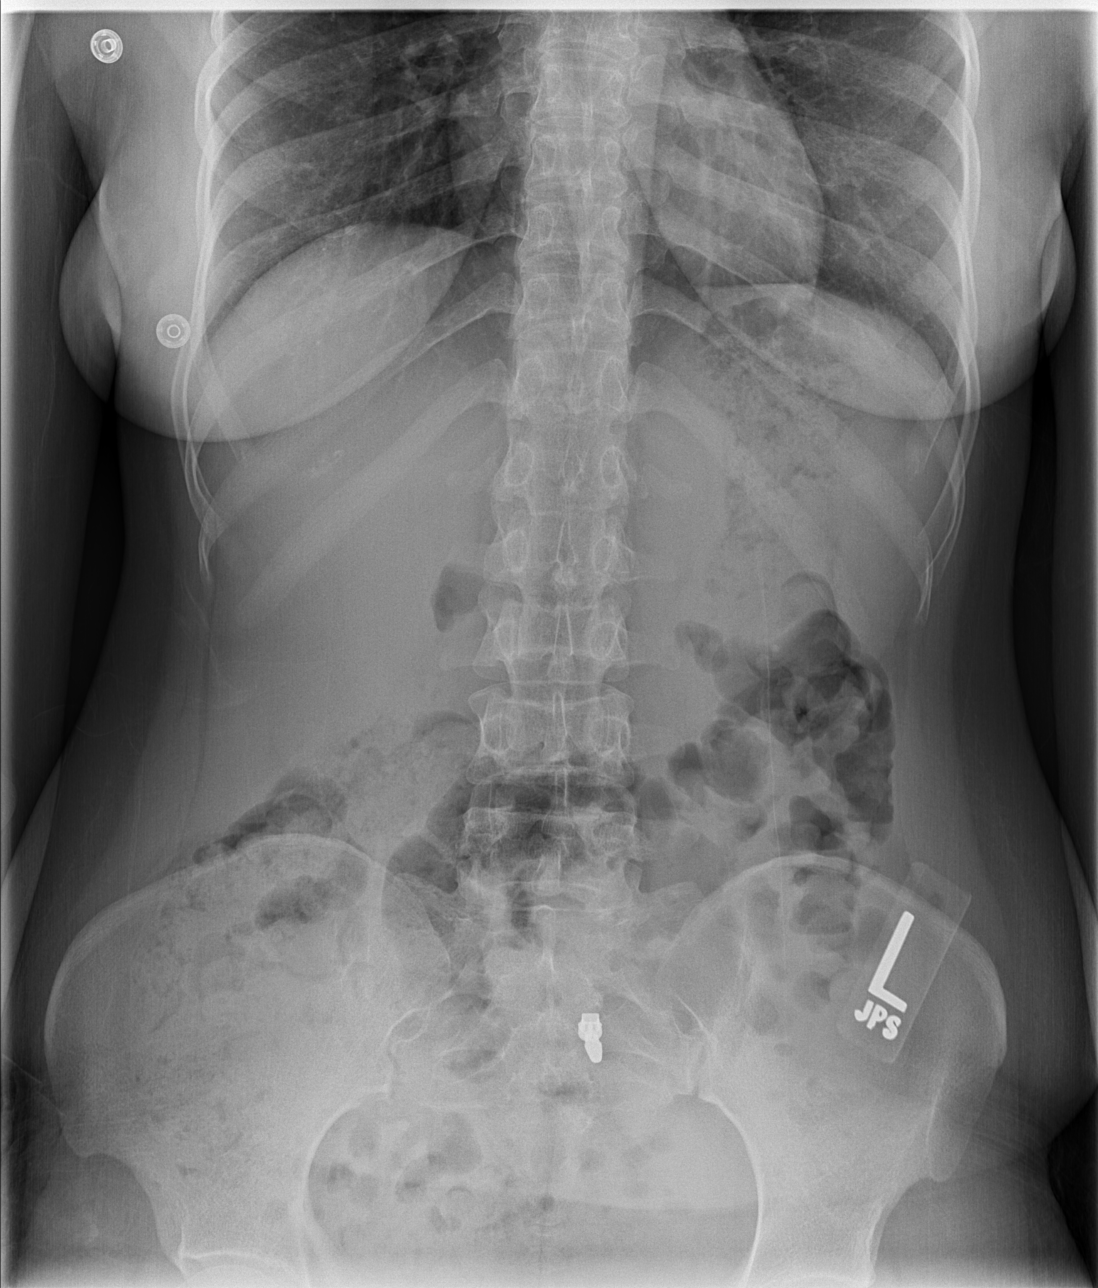

[t abdomen supine]
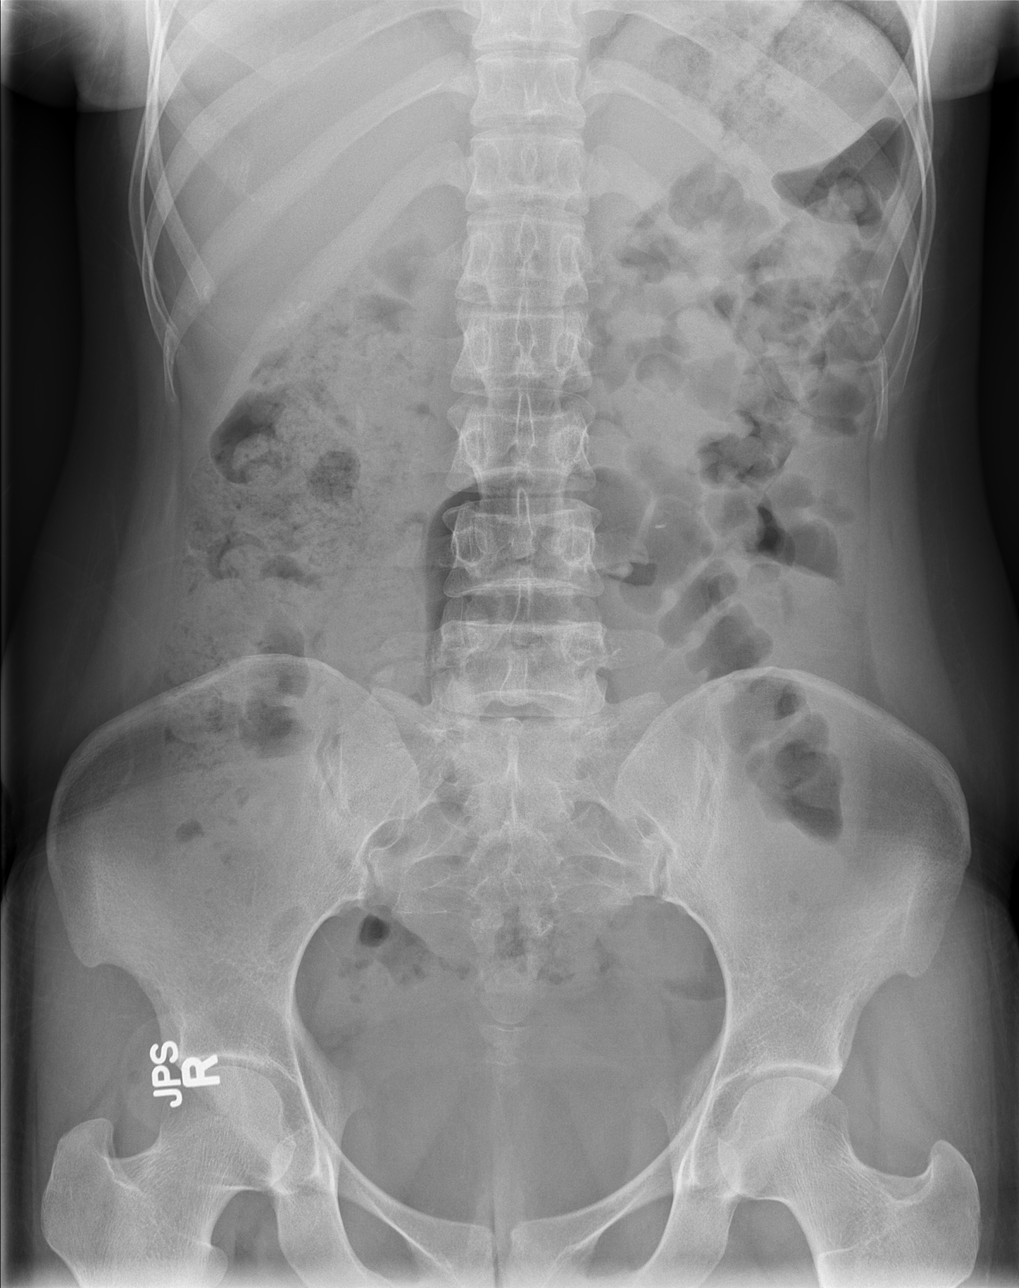

[3 of 3 positions shown; findings below may reference images not displayed]

FINDINGS: No active infiltrate or effusion is seen.  Mediastinal
contours appear normal.  The heart is within normal limits in size.
No bony abnormality is seen.

Supine and erect views of the abdomen show a moderate amount of
feces throughout the colon.  No bowel obstruction is seen.  No free
intraperitoneal air is noted.  No opaque calculi are seen.
IMPRESSION: 1.  No active lung disease.
2.  No bowel obstruction and no free air.  Moderate amount of feces
throughout the colon.

## 2013-08-20 ENCOUNTER — Telehealth (HOSPITAL_COMMUNITY): Payer: Self-pay

## 2013-08-20 ENCOUNTER — Emergency Department (HOSPITAL_COMMUNITY): Payer: Medicaid Other

## 2013-08-20 ENCOUNTER — Emergency Department (HOSPITAL_COMMUNITY)
Admission: EM | Admit: 2013-08-20 | Discharge: 2013-08-20 | Disposition: A | Discharge status: Home / Self Care | Payer: Medicaid Other | Attending: Emergency Medicine | Admitting: Emergency Medicine

## 2013-08-20 ENCOUNTER — Encounter (HOSPITAL_COMMUNITY): Payer: Self-pay | Admitting: Emergency Medicine

## 2013-08-20 DIAGNOSIS — K219 Gastro-esophageal reflux disease without esophagitis: Secondary | ICD-10-CM | POA: Insufficient documentation

## 2013-08-20 DIAGNOSIS — Z79899 Other long term (current) drug therapy: Secondary | ICD-10-CM | POA: Insufficient documentation

## 2013-08-20 MED ORDER — OXYCODONE-ACETAMINOPHEN 5-325 MG PO TABS
1.0000 | ORAL_TABLET | Freq: Once | ORAL | Status: AC
  Administered 2013-08-20: 1 via ORAL
  Filled 2013-08-20: qty 1

## 2013-08-20 MED ORDER — SUCRALFATE 1 G PO TABS
1.0000 g | ORAL_TABLET | Freq: Once | ORAL | Status: AC
  Administered 2013-08-20: 1 g via ORAL
  Filled 2013-08-20: qty 1

## 2013-08-20 MED ORDER — PANTOPRAZOLE SODIUM 20 MG PO TBEC: 20.0000 mg | DELAYED_RELEASE_TABLET | Freq: Every day | ORAL | Status: DC

## 2013-08-20 MED ORDER — SUCRALFATE 1 G PO TABS: 1.0000 g | ORAL_TABLET | Freq: Once | ORAL | Status: DC

## 2013-08-20 MED ORDER — GI COCKTAIL ~~LOC~~
30.0000 mL | Freq: Once | ORAL | Status: AC
  Administered 2013-08-20: 30 mL via ORAL
  Filled 2013-08-20: qty 30

## 2013-08-20 MED ORDER — FAMOTIDINE 20 MG PO TABS
40.0000 mg | ORAL_TABLET | Freq: Once | ORAL | Status: AC
  Administered 2013-08-20: 40 mg via ORAL
  Filled 2013-08-20: qty 2

## 2013-08-20 NOTE — ED Provider Notes (Signed)
CSN: [id number]     Arrival date & time 08/20/13  1142 History   First MD Initiated Contact with Patient 08/20/13 1336     Chief Complaint  Patient presents with  . Abdominal Pain     (Consider location/radiation/quality/duration/timing/severity/associated sxs/prior Treatment) Patient is a 23 y.o. female presenting with abdominal pain. The history is provided by the patient.  Abdominal Pain  patient here complaining of acute onset of epigastric pain that started after she coughed today. Was at her baseline until this occurred. Since that time she has had nausea but no vomiting. Pain does radiate to her back and characterized as sharp. Denies any dyspnea. No vomiting. No change in her stools. EMS was called and pulse ox symmetry was 99% on room-air. She does have a history of GERD and had an EGD performed in June of last year. She is currently feeling better and no treatment used prior to arrival. She denies any syncope or near-syncope. No weakness in upper or lower extremities.  Past Medical History  Diagnosis Date  . GERD (gastroesophageal reflux disease)    Past Surgical History  Procedure Laterality Date  . Cesarean section    . Esophagogastroduodenoscopy N/A 12/05/2012    Procedure: ESOPHAGOGASTRODUODENOSCOPY (EGD);  Surgeon: Petra KubaMarc E Magod, MD;  Location: [facility] ENDOSCOPY;  Service: Endoscopy;  Laterality: N/A;   No family history on file. History  Substance Use Topics  . Smoking status: Never Smoker   . Smokeless tobacco: Not on file  . Alcohol Use: No   OB History   Grav Para Term Preterm Abortions TAB SAB Ect Mult Living   2              Review of Systems  Gastrointestinal: Positive for abdominal pain.  All other systems reviewed and are negative.      Allergies  Review of patient's allergies indicates no known allergies.  Home Medications   Current Outpatient Rx  Name  Route  Sig  Dispense  Refill  . etonogestrel (IMPLANON) 68 MG IMPL implant   Subcutaneous  Inject 1 each into the skin once.         . Multiple Vitamins-Minerals (MULTIVITAMIN PO)   Oral   Take 1 tablet by mouth daily.          BP 105/70  Pulse 72  Temp(Src) 98.5 F (36.9 C) (Oral)  Resp 20  SpO2 99% Physical Exam  Nursing note and vitals reviewed. Constitutional: She is oriented to person, place, and time. She appears well-developed and well-nourished.  Non-toxic appearance. No distress.  HENT:  Head: Normocephalic and atraumatic.  Eyes: Conjunctivae, EOM and lids are normal. Pupils are equal, round, and reactive to light.  Neck: Normal range of motion. Neck supple. No tracheal deviation present. No mass present.  Cardiovascular: Normal rate, regular rhythm and normal heart sounds.  Exam reveals no gallop.   No murmur heard. Pulmonary/Chest: Effort normal and breath sounds normal. No stridor. No respiratory distress. She has no decreased breath sounds. She has no wheezes. She has no rhonchi. She has no rales.  Abdominal: Soft. Normal appearance and bowel sounds are normal. She exhibits no distension. There is tenderness in the epigastric area. There is no rigidity, no rebound, no guarding and no CVA tenderness.    Musculoskeletal: Normal range of motion. She exhibits no edema and no tenderness.  Neurological: She is alert and oriented to person, place, and time. She has normal strength. No cranial nerve deficit or sensory deficit. GCS eye subscore  is 4. GCS verbal subscore is 5. GCS motor subscore is 6.  Skin: Skin is warm and dry. No abrasion and no rash noted.  Psychiatric: She has a normal mood and affect. Her speech is normal and behavior is normal.    ED Course  Procedures (including critical care time) Labs Review Labs Reviewed - No data to display Imaging Review No results found.  EKG Interpretation   None       MDM   Final diagnoses:  None    Patient symptoms likely from reflux and she was given medications for this. She feels better. Repeat  exam shows no signs of surgical abdomen. Will need to followup with her Dr.    Toy Baker, MD 08/20/13 [phone]

## 2013-08-20 NOTE — Discharge Instructions (Signed)
Gastroesophageal Reflux Disease, Adult  Gastroesophageal reflux disease (GERD) happens when acid from your stomach flows up into the esophagus. When acid comes in contact with the esophagus, the acid causes soreness (inflammation) in the esophagus. Over time, GERD may create small holes (ulcers) in the lining of the esophagus.  CAUSES   · Increased body weight. This puts pressure on the stomach, making acid rise from the stomach into the esophagus.  · Smoking. This increases acid production in the stomach.  · Drinking alcohol. This causes decreased pressure in the lower esophageal sphincter (valve or ring of muscle between the esophagus and stomach), allowing acid from the stomach into the esophagus.  · Late evening meals and a full stomach. This increases pressure and acid production in the stomach.  · A malformed lower esophageal sphincter.  Sometimes, no cause is found.  SYMPTOMS   · Burning pain in the lower part of the mid-chest behind the breastbone and in the mid-stomach area. This may occur twice a week or more often.  · Trouble swallowing.  · Sore throat.  · Dry cough.  · Asthma-like symptoms including chest tightness, shortness of breath, or wheezing.  DIAGNOSIS   Your caregiver may be able to diagnose GERD based on your symptoms. In some cases, X-rays and other tests may be done to check for complications or to check the condition of your stomach and esophagus.  TREATMENT   Your caregiver may recommend over-the-counter or prescription medicines to help decrease acid production. Ask your caregiver before starting or adding any new medicines.   HOME CARE INSTRUCTIONS   · Change the factors that you can control. Ask your caregiver for guidance concerning weight loss, quitting smoking, and alcohol consumption.  · Avoid foods and drinks that make your symptoms worse, such as:  · Caffeine or alcoholic drinks.  · Chocolate.  · Peppermint or mint flavorings.  · Garlic and onions.  · Spicy foods.  · Citrus fruits,  such as oranges, lemons, or limes.  · Tomato-based foods such as sauce, chili, salsa, and pizza.  · Fried and fatty foods.  · Avoid lying down for the 3 hours prior to your bedtime or prior to taking a nap.  · Eat small, frequent meals instead of large meals.  · Wear loose-fitting clothing. Do not wear anything tight around your waist that causes pressure on your stomach.  · Raise the head of your bed 6 to 8 inches with wood blocks to help you sleep. Extra pillows will not help.  · Only take over-the-counter or prescription medicines for pain, discomfort, or fever as directed by your caregiver.  · Do not take aspirin, ibuprofen, or other nonsteroidal anti-inflammatory drugs (NSAIDs).  SEEK IMMEDIATE MEDICAL CARE IF:   · You have pain in your arms, neck, jaw, teeth, or back.  · Your pain increases or changes in intensity or duration.  · You develop nausea, vomiting, or sweating (diaphoresis).  · You develop shortness of breath, or you faint.  · Your vomit is green, yellow, black, or looks like coffee grounds or blood.  · Your stool is red, bloody, or black.  These symptoms could be signs of other problems, such as heart disease, gastric bleeding, or esophageal bleeding.  MAKE SURE YOU:   · Understand these instructions.  · Will watch your condition.  · Will get help right away if you are not doing well or get worse.  Document Released: 03/23/2005 Document Revised: 09/05/2011 Document Reviewed: 12/31/2010  ExitCare® Patient   Information ©2014 [employer].

## 2013-08-20 NOTE — ED Notes (Signed)
Pharmacy calling for clarification of Carfate Rx.  Dr Bernette MayersSheldon consulted Instructions as follows "Take 1 tablet up to 4 times a day as needed for GERD"  Pharmacy informed

## 2013-08-20 NOTE — ED Notes (Signed)
Nepalise is language spoken.

## 2013-08-20 NOTE — ED Notes (Signed)
Per EMS, pt picked up from work.  C/O abdominal pain starting 30 min prior to calling EMS.  Vitals: 104/ palpated, hr 99, resp 20, 99% ra

## 2013-11-01 ENCOUNTER — Encounter (HOSPITAL_COMMUNITY): Payer: Self-pay | Admitting: Emergency Medicine

## 2013-11-01 ENCOUNTER — Emergency Department (INDEPENDENT_AMBULATORY_CARE_PROVIDER_SITE_OTHER)
Admission: EM | Admit: 2013-11-01 | Discharge: 2013-11-01 | Disposition: A | Discharge status: Home / Self Care | Payer: Medicaid Other | Source: Home / Self Care

## 2013-11-01 DIAGNOSIS — M940 Chondrocostal junction syndrome [Tietze]: Secondary | ICD-10-CM

## 2013-11-01 DIAGNOSIS — R0789 Other chest pain: Secondary | ICD-10-CM

## 2013-11-01 DIAGNOSIS — K219 Gastro-esophageal reflux disease without esophagitis: Secondary | ICD-10-CM

## 2013-11-01 DIAGNOSIS — R071 Chest pain on breathing: Secondary | ICD-10-CM

## 2013-11-01 MED ORDER — PANTOPRAZOLE SODIUM 20 MG PO TBEC: 20.0000 mg | DELAYED_RELEASE_TABLET | Freq: Every day | ORAL | Status: DC

## 2013-11-01 NOTE — ED Notes (Signed)
Across chest and back

## 2013-11-01 NOTE — ED Provider Notes (Signed)
CSN: [id number]     Arrival date & time 11/01/13  1422 History   First MD Initiated Contact with Patient 11/01/13 1538     Chief Complaint  Patient presents with  . Chest Pain   (Consider location/radiation/quality/duration/timing/severity/associated sxs/prior Treatment) HPI Comments: [place] female seen here and by PCP and GI multiple times for various sx's, mostly the same in regards to  epigastric pain radiating to back and chest wall pain. Worse past 3-4 days. She is no longer taking her PPI. I have read her past MR's and they coincide with the hx and sx's she presents today. She points to the high epigastrium and the anterior costal margin/para sternal cartilage as the source of her pain and the reason she is here today.  Her significant other is Nurse, learning disabilitytranslator    Past Medical History  Diagnosis Date  . GERD (gastroesophageal reflux disease)    Past Surgical History  Procedure Laterality Date  . Cesarean section    . Esophagogastroduodenoscopy N/A 12/05/2012    Procedure: ESOPHAGOGASTRODUODENOSCOPY (EGD);  Surgeon: Petra KubaMarc E Magod, MD;  [facility];  Service: Endoscopy;  Laterality: N/A;   No family history on file. History  Substance Use Topics  . Smoking status: Never Smoker   . Smokeless tobacco: Not on file  . Alcohol Use: No   OB History   Grav Para Term Preterm Abortions TAB SAB Ect Mult Living   2              Review of Systems  Constitutional: Positive for activity change. Negative for fever and chills.  HENT: Negative.   Respiratory: Negative for cough and shortness of breath.   Cardiovascular: Positive for chest pain. Negative for leg swelling.  Gastrointestinal: Negative for vomiting and diarrhea.  Genitourinary: Negative.   Skin: Positive for color change.  Neurological: Positive for dizziness.    Allergies  Review of patient's allergies indicates no known allergies.  Home Medications   Prior to Admission medications   Medication Sig Start Date  End Date Taking? Authorizing Provider  etonogestrel (IMPLANON) 68 MG IMPL implant Inject 1 each into the skin once.    Historical Provider, MD  Multiple Vitamins-Minerals (MULTIVITAMIN PO) Take 1 tablet by mouth daily.    Historical Provider, MD  pantoprazole (PROTONIX) 20 MG tablet Take 1 tablet (20 mg total) by mouth daily. 08/20/13   Toy BakerAnthony T Allen, MD  sucralfate (CARAFATE) 1 G tablet Take 1 tablet (1 g total) by mouth once. 08/20/13   Toy BakerAnthony T Allen, MD   BP 101/63  Pulse 64  Temp(Src) 97.4 F (36.3 C) (Oral)  Resp 12  SpO2 98% Physical Exam  Nursing note and vitals reviewed. Constitutional: She appears well-developed and well-nourished. No distress.  Eyes: Conjunctivae and EOM are normal.  Neck: Normal range of motion. Neck supple.  Cardiovascular: Normal rate, regular rhythm, normal heart sounds and intact distal pulses.   Pulmonary/Chest: Effort normal and breath sounds normal. No respiratory distress. She has no wheezes. She has no rales. She exhibits tenderness.  Abdominal: Soft. She exhibits no distension.  Tenderness immediately inferior to the xyphoid as well as the xyphoid itself.  Lymphadenopathy:    She has no cervical adenopathy.  Neurological: She is alert. She exhibits normal muscle tone.  Skin: Skin is warm and dry.  Psychiatric:  Somewhat depressed affect    ED Course  Procedures (including critical care time) Labs Review Labs Reviewed - No data to display  Imaging Review No results found.  MDM   1. Costochondritis   2. Chest wall pain   3. GERD (gastroesophageal reflux disease)     Ice to chest wall protonix for epigastric discomfort F/U with PCP and or GI She has had EGD and previous evaluations for same complaints. No abnormal findings other than GERD.  EKG nl. NSR, no ectopy.  Pt is stable appears well.       Hayden Rasmussenavid Kaizlee Carlino, NP 11/01/13 [phone]

## 2013-11-01 NOTE — Discharge Instructions (Signed)
Chest Wall Pain °Chest wall pain is pain in or around the bones and muscles of your chest. It may take up to 6 weeks to get better. It may take longer if you must stay physically active in your work and activities.  °CAUSES  °Chest wall pain may happen on its own. However, it may be caused by: °· A viral illness like the flu. °· Injury. °· Coughing. °· Exercise. °· Arthritis. °· Fibromyalgia. °· Shingles. °HOME CARE INSTRUCTIONS  °· Avoid overtiring physical activity. Try not to strain or perform activities that cause pain. This includes any activities using your chest or your abdominal and side muscles, especially if heavy weights are used. °· Put ice on the sore area. °· Put ice in a plastic bag. °· Place a towel between your skin and the bag. °· Leave the ice on for 15-20 minutes per hour while awake for the first 2 days. °· Only take over-the-counter or prescription medicines for pain, discomfort, or fever as directed by your caregiver. °SEEK IMMEDIATE MEDICAL CARE IF:  °· Your pain increases, or you are very uncomfortable. °· You have a fever. °· Your chest pain becomes worse. °· You have new, unexplained symptoms. °· You have nausea or vomiting. °· You feel sweaty or lightheaded. °· You have a cough with phlegm (sputum), or you cough up blood. °MAKE SURE YOU:  °· Understand these instructions. °· Will watch your condition. °· Will get help right away if you are not doing well or get worse. °Document Released: 06/13/2005 Document Revised: 09/05/2011 Document Reviewed: 02/07/2011 °ExitCare® Patient Information ©2014 [employer]. ° °Costochondritis °Costochondritis, sometimes called Tietze syndrome, is a swelling and irritation (inflammation) of the tissue (cartilage) that connects your ribs with your breastbone (sternum). It causes pain in the chest and rib area. Costochondritis usually goes away on its own over time. It can take up to 6 weeks or longer to get better, especially if you are unable to limit your  activities. °CAUSES  °Some cases of costochondritis have no known cause. Possible causes include: °· Injury (trauma). °· Exercise or activity such as lifting. °· Severe coughing. °SIGNS AND SYMPTOMS °· Pain and tenderness in the chest and rib area. °· Pain that gets worse when coughing or taking deep breaths. °· Pain that gets worse with specific movements. °DIAGNOSIS  °Your health care provider will do a physical exam and ask about your symptoms. Chest X-rays or other tests may be done to rule out other problems. °TREATMENT  °Costochondritis usually goes away on its own over time. Your health care provider may prescribe medicine to help relieve pain. °HOME CARE INSTRUCTIONS  °· Avoid exhausting physical activity. Try not to strain your ribs during normal activity. This would include any activities using chest, abdominal, and side muscles, especially if heavy weights are used. °· Apply ice to the affected area for the first 2 days after the pain begins. °· Put ice in a plastic bag. °· Place a towel between your skin and the bag. °· Leave the ice on for 20 minutes, 2 3 times a day. °· Only take over-the-counter or prescription medicines as directed by your health care provider. °SEEK MEDICAL CARE IF: °· You have redness or swelling at the rib joints. These are signs of infection. °· Your pain does not go away despite rest or medicine. °SEEK IMMEDIATE MEDICAL CARE IF:  °· Your pain increases or you are very uncomfortable. °· You have shortness of breath or difficulty breathing. °· You   cough up blood.  You have worse chest pains, sweating, or vomiting.  You have a fever or persistent symptoms for more than 2 3 days.  You have a fever and your symptoms suddenly get worse. MAKE SURE YOU:   Understand these instructions.  Will watch your condition.  Will get help right away if you are not doing well or get worse. Document Released: 03/23/2005 Document Revised: 04/03/2013 Document Reviewed:  01/15/2013 [facility] Patient Information 2014 [place], [place].  Gastroesophageal Reflux Disease, Adult Gastroesophageal reflux disease (GERD) happens when acid from your stomach goes into your food pipe (esophagus). The acid can cause a burning feeling in your chest. Over time, the acid can make small holes (ulcers) in your food pipe.  HOME CARE  Ask your doctor for advice about:  Losing weight.  Quitting smoking.  Alcohol use.  Avoid foods and drinks that make your problems worse. You may want to avoid:  Caffeine and alcohol.  Chocolate.  Mints.  Garlic and onions.  Spicy foods.  Citrus fruits, such as oranges, lemons, or limes.  Foods that contain tomato, such as sauce, chili, salsa, and pizza.  Fried and fatty foods.  Avoid lying down for 3 hours before you go to bed or before you take a nap.  Eat small meals often, instead of large meals.  Wear loose-fitting clothing. Do not wear anything tight around your waist.  Raise (elevate) the head of your bed 6 to 8 inches with wood blocks. Using extra pillows does not help.  Only take medicines as told by your doctor.  Do not take aspirin or ibuprofen. GET HELP RIGHT AWAY IF:   You have pain in your arms, neck, jaw, teeth, or back.  Your pain gets worse or changes.  You feel sick to your stomach (nauseous), throw up (vomit), or sweat (diaphoresis).  You feel short of breath, or you pass out (faint).  Your throw up is green, yellow, black, or looks like coffee grounds or blood.  Your poop (stool) is red, bloody, or black. MAKE SURE YOU:   Understand these instructions.  Will watch your condition.  Will get help right away if you are not doing well or get worse. Document Released: 11/30/2007 Document Revised: 09/05/2011 Document Reviewed: 12/31/2010 [facility] Patient Information 2014 [place]ExitCare, [place]LL[place].

## 2013-11-02 NOTE — ED Provider Notes (Signed)
Medical screening examination/treatment/procedure(s) were performed by non-physician practitioner and as supervising physician I was immediately available for consultation/collaboration.  Josie Burleigh, M.D.  Khrystina Bonnes C Kmari Brian, MD 11/02/13 0910 

## 2014-01-10 ENCOUNTER — Emergency Department (HOSPITAL_COMMUNITY)
Admission: EM | Admit: 2014-01-10 | Discharge: 2014-01-10 | Disposition: A | Discharge status: Home / Self Care | Payer: Medicaid Other | Source: Home / Self Care | Attending: Family Medicine | Admitting: Family Medicine

## 2014-01-10 ENCOUNTER — Emergency Department (HOSPITAL_COMMUNITY)
Admission: EM | Admit: 2014-01-10 | Discharge: 2014-01-10 | Discharge status: Absent without leave | Payer: Medicaid Other | Source: Home / Self Care

## 2014-01-10 ENCOUNTER — Other Ambulatory Visit (HOSPITAL_COMMUNITY)
Admission: RE | Admit: 2014-01-10 | Discharge: 2014-01-10 | Disposition: A | Discharge status: Home / Self Care | Payer: Medicaid Other | Source: Ambulatory Visit | Attending: Family Medicine | Admitting: Family Medicine

## 2014-01-10 ENCOUNTER — Encounter (HOSPITAL_COMMUNITY): Payer: Self-pay | Admitting: Emergency Medicine

## 2014-01-10 DIAGNOSIS — Z113 Encounter for screening for infections with a predominantly sexual mode of transmission: Secondary | ICD-10-CM | POA: Diagnosis not present

## 2014-01-10 DIAGNOSIS — N760 Acute vaginitis: Secondary | ICD-10-CM | POA: Diagnosis present

## 2014-01-10 DIAGNOSIS — N920 Excessive and frequent menstruation with regular cycle: Secondary | ICD-10-CM

## 2014-01-10 DIAGNOSIS — K1230 Oral mucositis (ulcerative), unspecified: Secondary | ICD-10-CM

## 2014-01-10 DIAGNOSIS — K121 Other forms of stomatitis: Secondary | ICD-10-CM

## 2014-01-10 DIAGNOSIS — R21 Rash and other nonspecific skin eruption: Secondary | ICD-10-CM

## 2014-01-10 LAB — POCT URINALYSIS DIP (DEVICE)
Bilirubin Urine: NEGATIVE
Glucose, UA: NEGATIVE mg/dL
Hgb urine dipstick: NEGATIVE
KETONES UR: NEGATIVE mg/dL
Leukocytes, UA: NEGATIVE
Nitrite: NEGATIVE
PH: 7.5 (ref 5.0–8.0)
PROTEIN: NEGATIVE mg/dL
Specific Gravity, Urine: 1.02 (ref 1.005–1.030)
Urobilinogen, UA: 0.2 mg/dL (ref 0.0–1.0)

## 2014-01-10 LAB — POCT PREGNANCY, URINE: Preg Test, Ur: NEGATIVE

## 2014-01-10 MED ORDER — MAGIC MOUTHWASH W/LIDOCAINE: ORAL | Status: DC

## 2014-01-10 MED ORDER — HYDROQUINONE 4 % EX CREA: TOPICAL_CREAM | Freq: Two times a day (BID) | CUTANEOUS | Status: DC

## 2014-01-10 NOTE — ED Provider Notes (Signed)
CSN: [id number]     Arrival date & time 01/10/14  1641 History   First MD Initiated Contact with Patient 01/10/14 1728     Chief Complaint  Patient presents with  . Mouth Lesions   (Consider location/radiation/quality/duration/timing/severity/associated sxs/prior Treatment) HPI Comments: 23 year old female presents complaining of painful sores in her mouth for one week, prolonged menstrual periods, and dark patches of skin on her face and arm. Her period has been on for 2 weeks now which is abnormal for her, it is very light. She denies headache or dizziness. She has the dark patches for many months, they're asymptomatic except for their appearance. The main thing she is here for as the ulcers in her mouth. She has had these a few times in the past. She denies any systemic symptoms. The ulcers hurt whenever she or drinks anything  Patient is a 23 y.o. female presenting with mouth sores.  Mouth Lesions Associated symptoms: rash   Associated symptoms: no fever     Past Medical History  Diagnosis Date  . GERD (gastroesophageal reflux disease)    Past Surgical History  Procedure Laterality Date  . Cesarean section    . Esophagogastroduodenoscopy N/A 12/05/2012    Procedure: ESOPHAGOGASTRODUODENOSCOPY (EGD);  Surgeon: Petra Kuba, MD;  Location: [facility] ENDOSCOPY;  Service: Endoscopy;  Laterality: N/A;   No family history on file. History  Substance Use Topics  . Smoking status: Never Smoker   . Smokeless tobacco: Not on file  . Alcohol Use: No   OB History   Grav Para Term Preterm Abortions TAB SAB Ect Mult Living   2              Review of Systems  Constitutional: Negative for fever and chills.  HENT: Positive for mouth sores.   Eyes: Negative for visual disturbance.  Respiratory: Negative for cough and shortness of breath.   Cardiovascular: Negative for chest pain, palpitations and leg swelling.  Gastrointestinal: Negative for nausea, vomiting and abdominal pain.  Endocrine:  Negative for polydipsia and polyuria.  Genitourinary: Positive for menstrual problem. Negative for dysuria, urgency and frequency.  Musculoskeletal: Negative for arthralgias and myalgias.  Skin: Positive for rash.  Neurological: Negative for dizziness, weakness and light-headedness.  All other systems reviewed and are negative.   Allergies  Review of patient's allergies indicates no known allergies.  Home Medications   Prior to Admission medications   Medication Sig Start Date End Date Taking? Authorizing Provider  Alum & Mag Hydroxide-Simeth (MAGIC MOUTHWASH W/LIDOCAINE) SOLN 1 teaspoon gargle and spit out as needed whenever necessary 01/10/14   Graylon Good, PA-C  etonogestrel (IMPLANON) 68 MG IMPL implant Inject 1 each into the skin once.    Historical Provider, MD  hydroquinone 4 % cream Apply topically 2 (two) times daily. 01/10/14   Graylon Good, PA-C  Multiple Vitamins-Minerals (MULTIVITAMIN PO) Take 1 tablet by mouth daily.    Historical Provider, MD  pantoprazole (PROTONIX) 20 MG tablet Take 1 tablet (20 mg total) by mouth daily. 08/20/13   Toy [place] Luppino, MD  pantoprazole (PROTONIX) 20 MG tablet Take 1 tablet (20 mg total) by mouth daily. 11/01/13   Hayden Rasmussen, NP  sucralfate (CARAFATE) 1 G tablet Take 1 tablet (1 g total) by mouth once. 08/20/13   Toy Rodney Yera, MD   BP 95/54  Pulse 63  Temp(Src) 98.8 F (37.1 C) (Oral)  Resp 16  SpO2 100%  LMP 12/25/2013 Physical Exam  Nursing note and vitals reviewed. Constitutional:  She is oriented to person, place, and time. Vital signs are normal. She appears well-developed and well-nourished. No distress.  HENT:  Head: Normocephalic and atraumatic.  Right Ear: Tympanic membrane, external ear and ear canal normal.  Left Ear: Tympanic membrane, external ear and ear canal normal.  Nose: Nose normal. Right sinus exhibits no maxillary sinus tenderness and no frontal sinus tenderness. Left sinus exhibits no maxillary sinus  tenderness and no frontal sinus tenderness.  Mouth/Throat: Oral lesions (multiple shallow ulcerations of the buccal mucosa, gums, and on the tongue) present.  Eyes: Conjunctivae are normal. Right eye exhibits no discharge. Left eye exhibits no discharge.  Neck: Normal range of motion. Neck supple.  Cardiovascular: Normal rate, regular rhythm and normal heart sounds.   Pulmonary/Chest: Effort normal and breath sounds normal. No respiratory distress.  Genitourinary: Vagina normal. There is no rash, tenderness, lesion or injury on the right labia. There is no rash, tenderness, lesion or injury on the left labia. Cervix exhibits no motion tenderness, no discharge and no friability.  Lymphadenopathy:    She has no cervical adenopathy.       Right: No inguinal adenopathy present.       Left: No inguinal adenopathy present.  Neurological: She is alert and oriented to person, place, and time. She has normal strength. Coordination normal.  Skin: Skin is warm and dry. Rash (Slightly hyperpigmented patches on the cheeks and on the right antecubital fossa) noted. She is not diaphoretic.  Psychiatric: She has a normal mood and affect. Judgment normal.    ED Course  Procedures (including critical care time) Labs Review Labs Reviewed  HERPES SIMPLEX VIRUS CULTURE  POCT URINALYSIS DIP (DEVICE)  POCT PREGNANCY, URINE  CERVICOVAGINAL ANCILLARY ONLY    Imaging Review No results found.   MDM   1. Stomatitis   2. Rash   3. Menorrhagia with regular cycle    Treat the stomatitis with Magic mouthwash. Swabs sent to check for herpetic stomatitis.  Pelvic exam revealed no bleeding, referred to GYN  We'll try hydroquinone cream on the dark patches and referred to dermatology for followup  Meds ordered this encounter  Medications  . Alum & Mag Hydroxide-Simeth (MAGIC MOUTHWASH W/LIDOCAINE) SOLN    Sig: 1 teaspoon gargle and spit out as needed whenever necessary    Dispense:  200 mL    Refill:  0     Order Specific Question:  Supervising Provider    Answer:  Clementeen GrahamOREY, EVAN, S [[id number]]  . hydroquinone 4 % cream    Sig: Apply topically 2 (two) times daily.    Dispense:  57 g    Refill:  0    Order Specific Question:  Supervising Provider    Answer:  Clementeen GrahamOREY, EVAN, Kathie RhodesS [[id number]]     Graylon GoodZachary H Madilyn Cephas, PA-C 01/10/14 1950

## 2014-01-10 NOTE — Discharge Instructions (Signed)
Abnormal Uterine Bleeding Abnormal uterine bleeding can affect women at various stages in life, including teenagers, women in their reproductive years, pregnant women, and women who have reached menopause. Several kinds of uterine bleeding are considered abnormal, including:  Bleeding or spotting between periods.   Bleeding after sexual intercourse.   Bleeding that is heavier or more than normal.   Periods that last longer than usual.  Bleeding after menopause.  Many cases of abnormal uterine bleeding are minor and simple to treat, while others are more serious. Any type of abnormal bleeding should be evaluated by your health care provider. Treatment will depend on the cause of the bleeding. HOME CARE INSTRUCTIONS Monitor your condition for any changes. The following actions may help to alleviate any discomfort you are experiencing:  Avoid the use of tampons and douches as directed by your health care provider.  Change your pads frequently. You should get regular pelvic exams and Pap tests. Keep all follow-up appointments for diagnostic tests as directed by your health care provider.  SEEK MEDICAL CARE IF:   Your bleeding lasts more than 1 week.   You feel dizzy at times.  SEEK IMMEDIATE MEDICAL CARE IF:   You pass out.   You are changing pads every 15 to 30 minutes.   You have abdominal pain.  You have a fever.   You become sweaty or weak.   You are passing large blood clots from the vagina.   You start to feel nauseous and vomit. MAKE SURE YOU:   Understand these instructions.  Will watch your condition.  Will get help right away if you are not doing well or get worse. Document Released: 06/13/2005 Document Revised: 06/18/2013 Document Reviewed: 01/10/2013 [facility] Patient Information 2015 [place]. This information is not intended to replace advice given to you by your health care provider. Make sure you discuss any questions you have with your  health care provider.  Stomatitis Stomatitis is an inflammation of the mucous lining of the mouth. It can affect part of the mouth or the whole mouth. The intensity of symptoms can range from mild to severe. It can affect your cheek, teeth, gums, lips, or tongue. In almost all cases, the lining of the mouth becomes swollen, red, and painful. Painful ulcers can develop in your mouth. Stomatitis recurs in some people. CAUSES  There are many common causes of stomatitis. They include:  Viruses (such as cold sores or shingles).  Canker sores.  Bacteria (such as ulcerative gingivitis or sexually transmitted diseases).  Fungus or yeast (such as candidiasis or oral thrush).  Poor oral hygiene and poor nutrition (Vincent's stomatitis or trench mouth).  Lack of vitamin B, vitamin C, or niacin.  Dentures or braces that do not fit properly.  High acid foods (uncommon).  Sharp or broken teeth.  Cheek biting.  Breathing through the mouth.  Chewing tobacco.  Allergy to toothpaste, mouthwash, candy, gum, lipstick, or some medicines.  Burning your mouth with hot drinks or food.  Exposure to dyes, heavy metals, acid fumes, or mineral dust. SYMPTOMS   Painful ulcers in the mouth.  Blisters in the mouth.  Bleeding gums.  Swollen gums.  Irritability.  Bad breath.  Bad taste in the mouth.  Fever.  Trouble eating because of burning and pain in the mouth. DIAGNOSIS  Your caregiver will examine your mouth and look for bleeding gums and mouth ulcers. Your caregiver may ask you about the medicines you are taking. Your caregiver may suggest a blood  test and tissue sample (biopsy) of the mouth ulcer or mass if either is present. This will help find the cause of your condition. TREATMENT  Your treatment will depend on the cause of your condition. Your caregiver will first try to treat your symptoms.   You may be given pain medicine. Topical anesthetic may be used to numb the area if you  have severe pain.  Your caregiver may prescribe antibiotic medicine if you have a bacterial infection.  Your caregiver may prescribe antifungal medicine if you have a fungal infection.  You may need to take antiviral medicine if you have a viral infection like herpes.  You may be asked to use medicated mouth rinses.  Your caregiver will advise you about proper brushing and using a soft toothbrush. You also need to get your teeth cleaned regularly. HOME CARE INSTRUCTIONS   Maintain good oral hygiene. This is especially important for transplant patients.  Brush your teeth carefully with a soft, nylon-bristled toothbrush.  Floss at least 2 times a day.  Clean your mouth after eating.  Rinse your mouth with salt water 3 to 4 times a day.  Gargle with cold water.  Use topical numbing medicines to decrease pain if recommended by your caregiver.  Stop smoking, and stop using chewing or smokeless tobacco.  Avoid eating hot and spicy foods.  Eat soft and bland food.  Reduce your stress wherever possible.  Eat healthy and nutritious foods. SEEK MEDICAL CARE IF:   Your symptoms persist or get worse.  You develop new symptoms.  Your mouth ulcers are present for more than 3 weeks.  Your mouth ulcers come back frequently.  You have increasing difficulty with normal eating and drinking.  You have increasing fatigue or weakness.  You develop loss of appetite or nausea. SEEK IMMEDIATE MEDICAL CARE IF:   You have a fever.  You develop pain, redness, or sores around one or both eyes.  You cannot eat or drink because of pain or other symptoms.  You develop worsening weakness, or you faint.  You develop vomiting or diarrhea.  You develop chest pain, shortness of breath, or rapid and irregular heartbeats. MAKE SURE YOU:  Understand these instructions.  Will watch your condition.  Will get help right away if you are not doing well or get worse. Document Released:  04/10/2007 Document Revised: 09/05/2011 Document Reviewed: 01/20/2011 [facility] Patient Information 2015 [place], [place]. This information is not intended to replace advice given to you by your health care provider. Make sure you discuss any questions you have with your health care provider.

## 2014-01-10 NOTE — ED Notes (Signed)
Call from pharmacy w Rx [facility]

## 2014-01-10 NOTE — ED Notes (Signed)
Orthostatic  V/s    bp  102  /72   Pulse  68 lying          bp  103/70   Pulse  62    Sitting       bp  108 / 76  Pulse  78  Standing

## 2014-01-10 NOTE — ED Notes (Signed)
Pt  Has  Multiple    Sores/lesions  In mouth       X   1  Week       painfull  And  Hurts  To  Swallow      Pt  Also  Reports  Dry  Skin  With  Dark  Patches   And   Also  Reports  irreg  Periods

## 2014-01-11 NOTE — ED Provider Notes (Signed)
Medical screening examination/treatment/procedure(s) were performed by non-physician practitioner and as supervising physician I was immediately available for consultation/collaboration.  Hope Brandenburger, M.D.   Tykee Heideman C Amour Cutrone, MD 01/11/14 0842 

## 2014-01-13 LAB — HERPES SIMPLEX VIRUS CULTURE: Culture: NOT DETECTED

## 2014-04-11 ENCOUNTER — Telehealth: Payer: Self-pay | Admitting: Pediatrics

## 2014-04-11 MED ORDER — PERMETHRIN 5 % EX CREA: 1.0000 "application " | TOPICAL_CREAM | Freq: Once | CUTANEOUS | Status: DC

## 2014-04-11 NOTE — Telephone Encounter (Signed)
Lynnelle's [facility] and diagnosed with scabies.  The entire household is being treated at the same time.  Permethrin 5% cream Rx was sent to the pharmacy.

## 2014-04-28 ENCOUNTER — Encounter (HOSPITAL_COMMUNITY): Payer: Self-pay | Admitting: Emergency Medicine

## 2014-05-12 ENCOUNTER — Encounter (HOSPITAL_COMMUNITY): Payer: Self-pay | Admitting: *Deleted

## 2014-05-12 ENCOUNTER — Emergency Department (HOSPITAL_COMMUNITY)
Admission: EM | Admit: 2014-05-12 | Discharge: 2014-05-12 | Disposition: A | Discharge status: Home / Self Care | Payer: Medicaid Other | Attending: Emergency Medicine | Admitting: Emergency Medicine

## 2014-05-12 DIAGNOSIS — R51 Headache: Secondary | ICD-10-CM | POA: Diagnosis present

## 2014-05-12 DIAGNOSIS — K219 Gastro-esophageal reflux disease without esophagitis: Secondary | ICD-10-CM | POA: Diagnosis not present

## 2014-05-12 DIAGNOSIS — Z79899 Other long term (current) drug therapy: Secondary | ICD-10-CM | POA: Insufficient documentation

## 2014-05-12 DIAGNOSIS — K121 Other forms of stomatitis: Secondary | ICD-10-CM | POA: Diagnosis not present

## 2014-05-12 DIAGNOSIS — R519 Headache, unspecified: Secondary | ICD-10-CM

## 2014-05-12 MED ORDER — DIPHENHYDRAMINE HCL 50 MG/ML IJ SOLN
50.0000 mg | Freq: Once | INTRAMUSCULAR | Status: AC
  Administered 2014-05-12: 50 mg via INTRAMUSCULAR
  Filled 2014-05-12: qty 1

## 2014-05-12 MED ORDER — DEXAMETHASONE SODIUM PHOSPHATE 10 MG/ML IJ SOLN
10.0000 mg | Freq: Once | INTRAMUSCULAR | Status: AC
  Administered 2014-05-12: 10 mg via INTRAMUSCULAR
  Filled 2014-05-12: qty 1

## 2014-05-12 MED ORDER — MAGIC MOUTHWASH
2.0000 mL | Freq: Once | ORAL | Status: AC
  Administered 2014-05-12: 2 mL via ORAL
  Filled 2014-05-12: qty 5

## 2014-05-12 MED ORDER — MAGIC MOUTHWASH: 5.0000 mL | Freq: Three times a day (TID) | ORAL | Status: DC | PRN

## 2014-05-12 MED ORDER — METOCLOPRAMIDE HCL 5 MG/ML IJ SOLN
10.0000 mg | Freq: Once | INTRAMUSCULAR | Status: AC
  Administered 2014-05-12: 10 mg via INTRAMUSCULAR
  Filled 2014-05-12: qty 2

## 2014-05-12 NOTE — ED Notes (Signed)
Pt in c/o sore to inside of her mouth for the last week, one noted to inside of her lip and one on her tongue, also a headache and nausea

## 2014-05-12 NOTE — ED Provider Notes (Signed)
CSN: [id number]     Arrival date & time 05/12/14  1402 History   First MD Initiated Contact with Patient 05/12/14 1809     Chief Complaint  Patient presents with  . Mouth Lesions  . Headache     (Consider location/radiation/quality/duration/timing/severity/associated sxs/prior Treatment) HPI Kayla Bender is a 23 y.o. female with no significant past medical history who comes in for evaluation of headache and mouth sores. Patient speaks Nepali and history of present illness is given via interpreter.  Patient reports a headache onset at roughly 1:00 this afternoon. She describes it as gradual in onset. This headache is similar to headaches she has had in the past. She has not taken anything for her headache. She denies vision changes, neck stiffness,nausea or vomiting fevers, new rashes, or any numbness or weakness. Patient has a history of recurrent mouth ulcers, usually every 1-2 months. She has not been to her primary care for further evaluation of these sores. She reports being seen a couple months ago for the same sores, received Magic mouthwash and that relieved her discomfort. She has also had an EGD that was normal in June 2014.  Past Medical History  Diagnosis Date  . GERD (gastroesophageal reflux disease)    Past Surgical History  Procedure Laterality Date  . Cesarean section    . Esophagogastroduodenoscopy N/A 12/05/2012    Procedure: ESOPHAGOGASTRODUODENOSCOPY (EGD);  Surgeon: Petra KubaMarc E Magod, MD;  [facility];  Service: Endoscopy;  Laterality: N/A;   History reviewed. No pertinent family history. History  Substance Use Topics  . Smoking status: Never Smoker   . Smokeless tobacco: Not on file  . Alcohol Use: No   OB History    Gravida Para Term Preterm AB TAB SAB Ectopic Multiple Living   2              Review of Systems  Constitutional: Negative for fever.  HENT: Positive for mouth sores. Negative for sore throat.   Eyes: Negative for visual disturbance.   Respiratory: Negative for shortness of breath.   Cardiovascular: Negative for chest pain.  Gastrointestinal: Negative for abdominal pain.  Endocrine: Negative for polyuria.  Genitourinary: Negative for dysuria.  Skin: Negative for rash.  Neurological: Positive for headaches.      Allergies  Review of patient's allergies indicates no known allergies.  Home Medications   Prior to Admission medications   Medication Sig Start Date End Date Taking? Authorizing Provider  etonogestrel (IMPLANON) 68 MG IMPL implant Inject 1 each into the skin once.   Yes Historical Provider, MD  hydroquinone 4 % cream Apply topically 2 (two) times daily. 01/10/14  Yes Graylon GoodZachary H Baker, PA-C  Multiple Vitamins-Minerals (MULTIVITAMIN PO) Take 1 tablet by mouth daily.   Yes Historical Provider, MD  pantoprazole (PROTONIX) 20 MG tablet Take 1 tablet (20 mg total) by mouth daily. 08/20/13  Yes Toy BakerAnthony T Allen, MD  pantoprazole (PROTONIX) 20 MG tablet Take 1 tablet (20 mg total) by mouth daily. 11/01/13  Yes Hayden Rasmussenavid Mabe, NP  permethrin (ACTICIN) 5 % cream Apply 1 application topically once. 04/11/14  Yes Heber CarolinaKate S Ettefagh, MD  sucralfate (CARAFATE) 1 G tablet Take 1 tablet (1 g total) by mouth once. 08/20/13  Yes Toy BakerAnthony T Allen, MD  Alum & Mag Hydroxide-Simeth (MAGIC MOUTHWASH W/LIDOCAINE) SOLN 1 teaspoon gargle and spit out as needed whenever necessary Patient not taking: Reported on 05/12/2014 01/10/14   Graylon GoodZachary H Baker, PA-C   BP 105/50 mmHg  Pulse 72  Temp(Src) 97.8  F (36.6 C) (Oral)  Resp 20  SpO2 100% Physical Exam  Constitutional: She is oriented to person, place, and time. She appears well-developed and well-nourished.  HENT:  Head: Normocephalic and atraumatic.  Mouth/Throat: Oropharynx is clear and moist. No oropharyngeal exudate.  3 small aphthous ulcers located on the inside of right lower lip, anterior tip of her tongue, left buccal mucosa. No other gingival erythema or edema. No tonsillar  enlargement, uvula midline. No glossal elevation. Patent airway  Eyes: Conjunctivae and EOM are normal. Pupils are equal, round, and reactive to light. Right eye exhibits no discharge. Left eye exhibits no discharge. No scleral icterus.  Neck: Neck supple.  No meningeal signs  Cardiovascular: Normal rate, regular rhythm and normal heart sounds.   Pulmonary/Chest: Effort normal and breath sounds normal. No respiratory distress. She has no wheezes. She has no rales.  Abdominal: Soft. There is no tenderness.  Musculoskeletal: She exhibits no tenderness.  Neurological: She is alert and oriented to person, place, and time.  Cranial Nerves II-XII grossly intact. No focal neurodeficits. Motor and sensation 5/5. Gait baseline and appropriate without ataxia.  Skin: Skin is warm and dry. No rash noted.  Psychiatric: She has a normal mood and affect.  Nursing note and vitals reviewed.   ED Course  Procedures (including critical care time) Labs Review Labs Reviewed - No data to display  Imaging Review No results found.   EKG Interpretation None     Meds given in ED:  Medications  dexamethasone (DECADRON) injection 10 mg (10 mg Intramuscular Given 05/12/14 1943)  diphenhydrAMINE (BENADRYL) injection 50 mg (50 mg Intramuscular Given 05/12/14 1941)  metoCLOPramide (REGLAN) injection 10 mg (10 mg Intramuscular Given 05/12/14 1944)  magic mouthwash (2 mLs Oral Given 05/12/14 2002)    Discharge Medication List as of 05/12/2014  8:50 PM     Filed Vitals:   05/12/14 2000 05/12/14 2015 05/12/14 2027 05/12/14 2051  BP: 105/70 105/61 105/61 103/67  Pulse: 64 61 89 58  Temp:      TempSrc:      Resp:   18 18  SpO2: 100% 100% 99% 98%    MDM  Vitals stable - WNL -afebrile, pt BP is normal for her. Pt resting comfortably in ED. Patient feels much better after migraine cocktail. Headache is resolved. PE not concerning for other acute or emergent pathology. No focal neurodeficits. No evidence  of SAH, or other ICH. Doubt CVA, meningitis. No evidence of other emergent oral pathology, doubt Ludwig or Vincent angina, no PTA.  Will DC with Magic mouthwash for stomatitis. Encouraged NSAIDS at home for HA Discussed f/u with PCP and return precautions, pt very amenable to plan. Pt stable, in good condition and is appropriate for discharge. Prior to patient discharge, I discussed and reviewed this case with Dr.Linker      Final diagnoses:  Acute nonintractable headache, unspecified headache type  Stomatitis        Sharlene MottsBenjamin W Azharia Surratt, PA-C 05/13/14 1401  Ethelda ChickMartha K Linker, MD 05/17/14 [id number]

## 2014-07-01 ENCOUNTER — Encounter (HOSPITAL_COMMUNITY): Payer: Self-pay | Admitting: Emergency Medicine

## 2014-07-01 ENCOUNTER — Emergency Department (HOSPITAL_COMMUNITY)
Admission: EM | Admit: 2014-07-01 | Discharge: 2014-07-01 | Disposition: A | Discharge status: Home / Self Care | Payer: Medicaid Other | Attending: Emergency Medicine | Admitting: Emergency Medicine

## 2014-07-01 DIAGNOSIS — K219 Gastro-esophageal reflux disease without esophagitis: Secondary | ICD-10-CM | POA: Insufficient documentation

## 2014-07-01 DIAGNOSIS — R42 Dizziness and giddiness: Secondary | ICD-10-CM | POA: Diagnosis not present

## 2014-07-01 DIAGNOSIS — Z79899 Other long term (current) drug therapy: Secondary | ICD-10-CM | POA: Insufficient documentation

## 2014-07-01 DIAGNOSIS — Z331 Pregnant state, incidental: Secondary | ICD-10-CM | POA: Diagnosis not present

## 2014-07-01 DIAGNOSIS — R112 Nausea with vomiting, unspecified: Secondary | ICD-10-CM

## 2014-07-01 DIAGNOSIS — Z3490 Encounter for supervision of normal pregnancy, unspecified, unspecified trimester: Secondary | ICD-10-CM

## 2014-07-01 LAB — CBC WITH DIFFERENTIAL/PLATELET
Basophils Absolute: 0 10*3/uL (ref 0.0–0.1)
Basophils Relative: 0 % (ref 0–1)
Eosinophils Absolute: 0.1 10*3/uL (ref 0.0–0.7)
Eosinophils Relative: 1 % (ref 0–5)
HCT: 31.8 % — ABNORMAL LOW (ref 36.0–46.0)
Hemoglobin: 11 g/dL — ABNORMAL LOW (ref 12.0–15.0)
Lymphocytes Relative: 28 % (ref 12–46)
Lymphs Abs: 2 10*3/uL (ref 0.7–4.0)
MCH: 30.3 pg (ref 26.0–34.0)
MCHC: 34.6 g/dL (ref 30.0–36.0)
MCV: 87.6 fL (ref 78.0–100.0)
Monocytes Absolute: 0.4 10*3/uL (ref 0.1–1.0)
Monocytes Relative: 6 % (ref 3–12)
Neutro Abs: 4.7 10*3/uL (ref 1.7–7.7)
Neutrophils Relative %: 65 % (ref 43–77)
Platelets: 151 10*3/uL (ref 150–400)
RBC: 3.63 MIL/uL — ABNORMAL LOW (ref 3.87–5.11)
RDW: 12.5 % (ref 11.5–15.5)
WBC: 7.2 10*3/uL (ref 4.0–10.5)

## 2014-07-01 LAB — URINALYSIS, ROUTINE W REFLEX MICROSCOPIC
Bilirubin Urine: NEGATIVE
Glucose, UA: NEGATIVE mg/dL
Hgb urine dipstick: NEGATIVE
Ketones, ur: NEGATIVE mg/dL
Nitrite: NEGATIVE
Protein, ur: NEGATIVE mg/dL
Specific Gravity, Urine: 1.006 (ref 1.005–1.030)
Urobilinogen, UA: 1 mg/dL (ref 0.0–1.0)
pH: 7.5 (ref 5.0–8.0)

## 2014-07-01 LAB — COMPREHENSIVE METABOLIC PANEL
ALT: 22 U/L (ref 0–35)
AST: 28 U/L (ref 0–37)
Albumin: 3.9 g/dL (ref 3.5–5.2)
Alkaline Phosphatase: 45 U/L (ref 39–117)
Anion gap: 7 (ref 5–15)
BUN: 6 mg/dL (ref 6–23)
CO2: 26 mmol/L (ref 19–32)
Calcium: 9.3 mg/dL (ref 8.4–10.5)
Chloride: 104 mEq/L (ref 96–112)
Creatinine, Ser: 0.5 mg/dL (ref 0.50–1.10)
GFR calc Af Amer: >90 mL/min (ref >90)
GFR calc non Af Amer: >90 mL/min (ref >90)
Glucose, Bld: 96 mg/dL (ref 70–99)
Potassium: 3.9 mmol/L (ref 3.5–5.1)
Sodium: 137 mmol/L (ref 135–145)
Total Bilirubin: 0.5 mg/dL (ref 0.3–1.2)
Total Protein: 6.7 g/dL (ref 6.0–8.3)

## 2014-07-01 LAB — URINE MICROSCOPIC-ADD ON

## 2014-07-01 LAB — POC URINE PREG, ED: Preg Test, Ur: POSITIVE — AB

## 2014-07-01 MED ORDER — ONDANSETRON HCL 4 MG/2ML IJ SOLN
4.0000 mg | Freq: Once | INTRAMUSCULAR | Status: AC
  Administered 2014-07-01: 4 mg via INTRAVENOUS
  Filled 2014-07-01: qty 2

## 2014-07-01 MED ORDER — ONDANSETRON HCL 4 MG PO TABS: 4.0000 mg | ORAL_TABLET | Freq: Four times a day (QID) | ORAL | Status: DC

## 2014-07-01 MED ORDER — SODIUM CHLORIDE 0.9 % IV BOLUS (SEPSIS)
1000.0000 mL | Freq: Once | INTRAVENOUS | Status: AC
  Administered 2014-07-01: 1000 mL via INTRAVENOUS

## 2014-07-01 NOTE — Discharge Instructions (Signed)
Nausea and Vomiting Nausea means you feel sick to your stomach. Throwing up (vomiting) is a reflex where stomach contents come out of your mouth. HOME CARE   Take medicine as told by your doctor.  Do not force yourself to eat. However, you do need to drink fluids.  If you feel like eating, eat a normal diet as told by your doctor.  Eat rice, wheat, potatoes, bread, lean meats, yogurt, fruits, and vegetables.  Avoid high-fat foods.  Drink enough fluids to keep your pee (urine) clear or pale yellow.  Ask your doctor how to replace body fluid losses (rehydrate). Signs of body fluid loss (dehydration) include:  Feeling very thirsty.  Dry lips and mouth.  Feeling dizzy.  Dark pee.  Peeing less than normal.  Feeling confused.  Fast breathing or heart rate. GET HELP RIGHT AWAY IF:   You have blood in your throw up.  You have black or bloody poop (stool).  You have a bad headache or stiff neck.  You feel confused.  You have bad belly (abdominal) pain.  You have chest pain or trouble breathing.  You do not pee at least once every 8 hours.  You have cold, clammy skin.  You keep throwing up after 24 to 48 hours.  You have a fever. MAKE SURE YOU:   Understand these instructions.  Will watch your condition.  Will get help right away if you are not doing well or get worse. Document Released: 11/30/2007 Document Revised: 09/05/2011 Document Reviewed: 11/12/2010 [facility] Patient Information 2015 [place], [place]. This information is not intended to replace advice given to you by your health care provider. Make sure you discuss any questions you have with your health care provider.  Prenatal Care  WHAT IS PRENATAL CARE?  Prenatal care means health care during your pregnancy, before your baby is born. It is very important to take care of yourself and your baby during your pregnancy by:   Getting early prenatal care. If you know you are pregnant, or think you might be  pregnant, call your health care provider as soon as possible. Schedule a visit for a prenatal exam.  Getting regular prenatal care. Follow your health care provider's schedule for blood and other necessary tests. Do not miss appointments.  Doing everything you can to keep yourself and your baby healthy during your pregnancy.  Getting complete care. Prenatal care should include evaluation of the medical, dietary, educational, psychological, and social needs of you and your significant other. The medical and genetic history of your family and the family of your baby's father should be discussed with your health care provider.  Discussing with your health care provider:  Prescription, over-the-counter, and herbal medicines that you take.  Any history of substance abuse, alcohol use, smoking, and illegal drug use.  Any history of domestic abuse and violence.  Immunizations you have received.  Your nutrition and diet.  The amount of exercise you do.  Any environmental and occupational hazards to which you are exposed.  History of sexually transmitted infections for both you and your partner.  Previous pregnancies you have had. WHY IS PRENATAL CARE SO IMPORTANT?  By regularly seeing your health care provider, you help ensure that problems can be identified early so that they can be treated as soon as possible. Other problems might be prevented. Many studies have shown that early and regular prenatal care is important for the health of mothers and their babies.  HOW CAN I TAKE CARE OF MYSELF  WHILE I AM PREGNANT?  Here are ways to take care of yourself and your baby:   Start or continue taking your multivitamin with 400 micrograms (mcg) of folic acid every day.  Get early and regular prenatal care. It is very important to see a health care provider during your pregnancy. Your health care provider will check at each visit to make sure that you and your baby are healthy. If there are any  problems, action can be taken right away to help you and your baby.  Eat a healthy diet that includes:  Fruits.  Vegetables.  Foods low in saturated fat.  Whole grains.  Calcium-rich foods, such as milk, yogurt, and hard cheeses.  Drink 6-8 glasses of liquids a day.  Unless your health care provider tells you not to, try to be physically active for 30 minutes, most days of the week. If you are pressed for time, you can get your activity in through 10-minute segments, three times a day.  Do not smoke, drink alcohol, or use drugs. These can cause long-term damage to your baby. Talk with your health care provider about steps to take to stop smoking. Talk with a member of your faith community, a counselor, a trusted friend, or your health care provider if you are concerned about your alcohol or drug use.  Ask your health care provider before taking any medicine, even over-the-counter medicines. Some medicines are not safe to take during pregnancy.  Get plenty of rest and sleep.  Avoid hot tubs and saunas during pregnancy.  Do not have X-rays taken unless absolutely necessary and with the recommendation of your health care provider. A lead shield can be placed on your abdomen to protect your baby when X-rays are taken in other parts of your body.  Do not empty the cat litter when you are pregnant. It may contain a parasite that causes an infection called toxoplasmosis, which can cause birth defects. Also, use gloves when working in garden areas used by cats.  Do not eat uncooked or undercooked meats or fish.  Do not eat soft, mold-ripened cheeses (Brie, Camembert, and chevre) or soft, blue-veined cheese (Danish blue and Roquefort).  Stay away from toxic chemicals like:  Insecticides.  Solvents (some cleaners or paint thinners).  Lead.  Mercury.  Sexual intercourse may continue until the end of the pregnancy, unless you have a medical problem or there is a problem with the  pregnancy and your health care provider tells you not to.  Do not wear high-heel shoes, especially during the second half of the pregnancy. You can lose your balance and fall.  Do not take long trips, unless absolutely necessary. Be sure to see your health care provider before going on the trip.  Do not sit in one position for more than 2 hours when on a trip.  Take a copy of your medical records when going on a trip. Know where a hospital is located in the city you are visiting, in case of an emergency.  Most dangerous household products will have pregnancy warnings on their labels. Ask your health care provider about products if you are unsure.  Limit or eliminate your caffeine intake from coffee, tea, sodas, medicines, and chocolate.  Many women continue working through pregnancy. Staying active might help you stay healthier. If you have a question about the safety or the hours you work at your particular job, talk with your health care provider.  Get informed:  Read books.  Watch videos.  Go to childbirth classes for you and your significant other.  Talk with experienced moms.  Ask your health care provider about childbirth education classes for you and your partner. Classes can help you and your partner prepare for the birth of your baby.  Ask about a baby doctor (pediatrician) and methods and pain medicine for labor, delivery, and possible cesarean delivery. HOW OFTEN SHOULD I SEE MY HEALTH CARE PROVIDER DURING PREGNANCY?  Your health care provider will give you a schedule for your prenatal visits. You will have visits more often as you get closer to the end of your pregnancy. An average pregnancy lasts about 40 weeks.  A typical schedule includes visiting your health care provider:   About once each month during your first 6 months of pregnancy.  Every 2 weeks during the next 2 months.  Weekly in the last month, until the delivery date. Your health care provider will  probably want to see you more often if:  You are older than 35 years.  Your pregnancy is high risk because you have certain health problems or problems with the pregnancy, such as:  Diabetes.  High blood pressure.  The baby is not growing on schedule, according to the dates of the pregnancy. Your health care provider will do special tests to make sure you and your baby are not having any serious problems. WHAT HAPPENS DURING PRENATAL VISITS?   At your first prenatal visit, your health care provider will do a physical exam and talk to you about your health history and the health history of your partner and your family. Your health care provider will be able to tell you what date to expect your baby to be born on.  Your first physical exam will include checks of your blood pressure, measurements of your height and weight, and an exam of your pelvic organs. Your health care provider will do a Pap test if you have not had one recently and will do cultures of your cervix to make sure there is no infection.  At each prenatal visit, there will be tests of your blood, urine, blood pressure, weight, and the progress of the baby will be checked.  At your later prenatal visits, your health care provider will check how you are doing and how your baby is developing. You may have a number of tests done as your pregnancy progresses.  Ultrasound exams are often used to check on your baby's growth and health.  You may have more urine and blood tests, as well as special tests, if needed. These may include amniocentesis to examine fluid in the pregnancy sac, stress tests to check how the baby responds to contractions, or a biophysical profile to measure your baby's well-being. Your health care provider will explain the tests and why they are necessary.  You should be tested for high blood sugar (gestational diabetes) between the 24th and 28th weeks of your pregnancy.  You should discuss with your health  care provider your plans to breastfeed or bottle-feed your baby.  Each visit is also a chance for you to learn about staying healthy during pregnancy and to ask questions. Document Released: 06/16/2003 Document Revised: 06/18/2013 Document Reviewed: 08/28/2013 [facility] Patient Information 2015 [place], [place]. This information is not intended to replace advice given to you by your health care provider. Make sure you discuss any questions you have with your health care provider.   Emergency Department Resource Guide 1) Find a Doctor and Pay Out of Pocket Although you  won't have to find out who is covered by your insurance plan, it is a good idea to ask around and get recommendations. You will then need to call the office and see if the doctor you have chosen will accept you as a new patient and what types of options they offer for patients who are self-pay. Some doctors offer discounts or will set up payment plans for their patients who do not have insurance, but you will need to ask so you aren't surprised when you get to your appointment.  2) Contact Your Local Health Department Not all health departments have doctors that can see patients for sick visits, but many do, so it is worth a call to see if yours does. If you don't know where your local health department is, you can check in your phone book. The CDC also has a tool to help you locate your state's health department, and many state websites also have listings of all of their local health departments.  3) Find a Walk-in Clinic If your illness is not likely to be very severe or complicated, you may want to try a walk in clinic. These are popping up all over the country in pharmacies, drugstores, and shopping centers. They're usually staffed by nurse practitioners or physician assistants that have been trained to treat common illnesses and complaints. They're usually fairly quick and inexpensive. However, if you have serious medical issues or  chronic medical problems, these are probably not your best option.  No Primary Care Doctor: - Call Health Connect at  [phone] - they can help you locate a primary care doctor that  accepts your insurance, provides certain services, etc. - Physician Referral Service- [phone]  Chronic Pain Problems: Organization         Address  Phone   Notes  [facility]  [phone] Patients need to be referred by their primary care doctor.   Medication Assistance: Organization         Address  Phone   Notes  [facility] Med[facility] [address] [place], [place] [address] [phone] --Must be a resident of [facility] -- Must have NO insurance coverage whatsoever (no Medicaid/ Medicare, etc.) -- The pt. MUST have a primary care doctor that directs their care regularly and follows them in the community   [facility]  [phone]   [employer]  [phone]    Agencies that provide inexpensive medical care: Organization         Address  Phone   Notes  [facility]  [phone]   [facility]    [phone]   [facility] [address] [place], [place] [address] [phone]   [facility] [address] [place] [phone]   Planned Parenthood    [phone]   [facility]    [phone]   [facility]  [address], [place] Phone:  [phone], Fax:  [phone] Hours of Operation:  9 am - 6 pm, M-F.  Also accepts Medicaid/Medicare and self-pay.  [facility]  [address], [place] Phone: [phone], Fax: [phone]. Hours of Operation:  8:30 am - 5:30 pm, M-F.  Also accepts Medicaid and self-pay.  [facility] [place] [address], [employer] Phone: [phone]   [facility] [address] [place], [place]  [phone], Ext. [phone] Mondays & Thursdays: 7-9 AM.  First 15 patients are seen on a first come, first serve basis.    Medicaid-accepting [facility] Providers:  Organization         Address  Phone   Notes  [facility] [address], [place] [phone] Also accepts self-pay patients.  [facility] [address] [place]urell Joseph[place], [place]  [phone]   [facility] [address], [place] [phone]   [facility] [address] [place] [phone]   Renaye Rakers [address], [place]   [phone] Only accepts [place] patients after they have their name applied to their card.   Self-Pay (no insurance) in [facility]:  Organization         Address  Phone   Notes  Sickle Cell Patients, [facility] [address] [place], [place] [phone]   [facility] [address] [place], [place] [phone]   [facility] [place]  [address], [place] [phone]   [facility]/Dr. Osei-Bonsu  [address] [place] or [address], [place] [phone] Phone number for both [place] and [place] locations is the same.  [facility] [address], [place] [phone]   [facility] [address] [place] or [address] [phone]   [facility] [address] [place] [phone], phone; [phone], fax Sees patients 1st and 3rd Saturday of every month.  Must not qualify for public or private insurance (i.e. Medicaid, Medicare, [place] Health Choice, Veterans' Benefits)  Household income should be no more than 200% of the poverty level The clinic cannot treat you if you are pregnant or think you are pregnant  Sexually transmitted  diseases are not treated at the clinic.    Dental Care: Organization         Address  Phone  Notes  [facility] Department of [facility] [address] [place], [place] [phone] Accepts children up to age 21 who are enrolled in [place] or [place] Health Choice; pregnant women with a Medicaid card; and children who have applied for Medicaid or [place] Health Choice, but were declined, whose parents can pay a reduced fee at time of service.  [facility] Department of [facility]  [address], [place] [phone] Accepts children up to age 56 who are enrolled in [place] or [place] Health Choice; pregnant women with a Medicaid card; and children who have applied for Medicaid or [place] Health Choice, but were declined, whose parents can pay a reduced fee at time of service.  [facility]  [address] [place], [place] [phone] Patients are seen by appointment only. Walk-ins are not accepted. [facility] will see patients 55 years of age and older. Monday - Tuesday (8am-5pm) Most Wednesdays (8:30-5pm) $30 per visit, cash only  [facility] Adult Dental Access PRO[facility]  [address], [facility] [phone] Patients are seen by appointment only. Walk-ins are not accepted. [facility] will see patients 48 years of age and older. One Wednesday Evening (Monthly: Volunteer Based).  $30 per visit, cash only  [employer] of  Dentistry Clinics  [phone] for adults; Children under age 414, call Graduate Pediatric Dentistry at [phone]. Children aged 544-14, please call [phone] to request a pediatric application.  Dental services are provided in all areas of dental care including fillings, crowns and bridges, complete and partial dentures, implants, gum treatment, root canals, and extractions. Preventive care is also provided. Treatment is provided to both adults and children. Patients are selected via a  lottery and there is often a waiting list.   [facility] [address], [place]  [phone] [email or web address]   [facility] [address], [place], [place] [phone], Ext. [phone] Second and Fourth Thursday of each month, opens at 6:30 AM; Clinic ends at 9 AM.  Patients are seen on a first-come first-served basis, and a limited number are seen during each clinic.   [facility]  [address], [place], [place] [phone]   Eligibility Requirements You must have lived in [place], [place], or [place] counties for at least the last three months.   You cannot be eligible for state or federal sponsored [place]healthcare insurance, including [place]IG[employer]eterans Administration, [place]icaid, or [employer]icare.   You generally cannot be eligible for healthcare insurance through your employer.    How to apply: Eligibility screenings are held every Tuesday and Wednesday afternoon from 1:00 pm until 4:00 pm. You do not need an appointment for the interview!  [facility] [address], [place], [place] [phone]   [facility]  [phone]   [facility]  [phone]   [facility]  [phone]    Behavioral Health Resources in the Community: Intensive Outpatient Programs Organization         Address  Phone  Notes  [facility] [address], [place], [place] [phone]   [facility] [address], [place], [place] [phone]   [facility] [address], [place], [place]  [phone]   [facility] [address],  [place], [place] [phone] or [phone]   Substance Abuse Resources Organization         Address  Phone  Notes  [facility]  [phone]   [facility]  [phone]   [facility]  [phone]   [facility]  [phone]   Residential &  Outpatient Substance Abuse Program  [phone]   Psychological Services Organization         Address  Phone  Notes  [facility]  [phone]- [phone]   [facility]  [phone]- [phone]   [facility] [address], [place] [phone] or [phone]    Mobile Crisis Teams Organization         Address  Phone  Notes  [facility], Mobile Crisis Care Unit  [phone]   [facility]  [address] [place], [place] [phone]   Doristine LocksSharon DeEsch [address] [place] [phone]    Self-Help/Support Groups Organization         Address  Phone             Notes  [facility] [place] - variety of support groups  [phone] Call for more information  Narcotics Anonymous (NA), [facility] [address], [place] Page  2 meetings at this location   [facility] Organization         Address  Phone  Notes  [facility] [address],    [place]  [phone]   [facility]  [address], [place] [phone], [place], [place] [phone]   [facility] [address], [place] [phone] Admissions: 8am-3pm M-F  [facility] [address]    [place], [place] [phone]   [facility] [address], [place], [place] [phone]   [facility] [address]  [place], [place] [phone]   [facility] - Intensive Outpatient [address], [place], [place] [phone]   [facility] ([facility] [address]  [place], [place] [phone] or [phone]   [facility] ([facility]) [address] [place], [place] [phone] Accepts Medicaid  [facility] [address]  [place] [phone] Substance Abuse/Addiction Treatment   [facility] Resources Organization          Address  Phone  Notes  [facility]  [phone]   Angie FavaJulie Brannon, PhD [address] [place], [place]   [phone] or [phone]   [facility]   [address] [place], [place] [phone]   [facility] [address] [address], [place], [place] [phone] Insurance/Medicaid/sponsorship [facility]  [facility] [address]                                    [place], [place] [phone] Therapy/tele-psych/case  [facility] [address]   [place], [place] [phone]    Dr. Lolly MustacheArfeen  [phone]   [facility]  [employer] [facility] 1) [address], [place] 2) [address], [place] 3)  [address], [place] [phone]  [phone]   [facility] Child Abuse Hotline [phone] or [phone] (After Hours)

## 2014-07-01 NOTE — ED Notes (Addendum)
Pt has been vomiting x 1 week everyday and has cramping in arms and legs; dizziness. Unsure if pregnant. 2 mos late on period and having epigastric pain. Hx GERD.

## 2014-07-01 NOTE — ED Provider Notes (Signed)
CSN: [id number]     Arrival date & time 07/01/14  1142 History   First MD Initiated Contact with Patient 07/01/14 1350     Chief Complaint  Patient presents with  . Emesis     (Consider location/radiation/quality/duration/timing/severity/associated sxs/prior Treatment) HPI   23yF with n/v. Language barrier and interpretor used. Intermittent for the past week. Denies any abdominal pain but is having some cramping in her bilateral thighs. Occasionally feels a little bit lightheaded. No shortness of breath. No chest pain. Patient's last ThurmanMitchell. Is approximately 7-1/2 weeks ago. She's not sure she is pregnant or not. No urinary complaints. No unusual vaginal bleeding or discharge. No diarrhea. No sick contacts.  Past Medical History  Diagnosis Date  . GERD (gastroesophageal reflux disease)    Past Surgical History  Procedure Laterality Date  . Cesarean section    . Esophagogastroduodenoscopy N/A 12/05/2012    Procedure: ESOPHAGOGASTRODUODENOSCOPY (EGD);  Surgeon: Petra KubaMarc E Magod, MD;  [facility];  Service: Endoscopy;  Laterality: N/A;   History reviewed. No pertinent family history. History  Substance Use Topics  . Smoking status: Never Smoker   . Smokeless tobacco: Not on file  . Alcohol Use: No   OB History    Gravida Para Term Preterm AB TAB SAB Ectopic Multiple Living   2              Review of Systems  All systems reviewed and negative, other than as noted in HPI.   Allergies  Review of patient's allergies indicates no known allergies.  Home Medications   Prior to Admission medications   Medication Sig Start Date End Date Taking? Authorizing Provider  Alum & Mag Hydroxide-Simeth (MAGIC MOUTHWASH W/LIDOCAINE) SOLN 1 teaspoon gargle and spit out as needed whenever necessary Patient not taking: Reported on 05/12/2014 01/10/14   Graylon GoodZachary H Baker, PA-C  Alum & Mag Hydroxide-Simeth (MAGIC MOUTHWASH) SOLN Take 5 mLs by mouth 3 (three) times daily as needed for mouth  pain. 1:1 ration of Benadryl and Maalox 05/12/14   Earle GellBenjamin W Cartner, PA-C  etonogestrel (IMPLANON) 68 MG IMPL implant Inject 1 each into the skin once.    Historical Provider, MD  hydroquinone 4 % cream Apply topically 2 (two) times daily. 01/10/14   Graylon GoodZachary H Baker, PA-C  Multiple Vitamins-Minerals (MULTIVITAMIN PO) Take 1 tablet by mouth daily.    Historical Provider, MD  pantoprazole (PROTONIX) 20 MG tablet Take 1 tablet (20 mg total) by mouth daily. 08/20/13   Toy BakerAnthony T Allen, MD  pantoprazole (PROTONIX) 20 MG tablet Take 1 tablet (20 mg total) by mouth daily. 11/01/13   Hayden Rasmussenavid Mabe, NP  permethrin (ACTICIN) 5 % cream Apply 1 application topically once. 04/11/14   Heber CarolinaKate S Ettefagh, MD  sucralfate (CARAFATE) 1 G tablet Take 1 tablet (1 g total) by mouth once. 08/20/13   Toy BakerAnthony T Allen, MD   BP 101/57 mmHg  Pulse 69  Temp(Src) 98.2 F (36.8 C) (Oral)  Resp 16  SpO2 100%  LMP 05/01/2014 Physical Exam  Constitutional: She appears well-developed and well-nourished. No distress.  HENT:  Head: Normocephalic and atraumatic.  Eyes: Conjunctivae are normal. Right eye exhibits no discharge. Left eye exhibits no discharge.  Neck: Neck supple.  Cardiovascular: Normal rate, regular rhythm and normal heart sounds.  Exam reveals no gallop and no friction rub.   No murmur heard. Pulmonary/Chest: Effort normal and breath sounds normal. No respiratory distress.  Abdominal: Soft. She exhibits no distension. There is no tenderness.  Musculoskeletal: She  exhibits no edema or tenderness.  Neurological: She is alert.  Skin: Skin is warm and dry.  Psychiatric: She has a normal mood and affect. Her behavior is normal. Thought content normal.  Nursing note and vitals reviewed.   ED Course  Procedures (including critical care time) Labs Review Labs Reviewed  CBC WITH DIFFERENTIAL - Abnormal; Notable for the following:    RBC 3.63 (*)    Hemoglobin 11.0 (*)    HCT 31.8 (*)    All other components within  normal limits  POC URINE PREG, ED - Abnormal; Notable for the following:    Preg Test, Ur POSITIVE (*)    All other components within normal limits  COMPREHENSIVE METABOLIC PANEL  URINALYSIS, ROUTINE W REFLEX MICROSCOPIC    Imaging Review No results found.   EKG Interpretation None      MDM   Final diagnoses:  Non-intractable vomiting with nausea, vomiting of unspecified type  Pregnancy    23yF with n/v. Likely pregnancy related. Benign abdominal exam. Mild hypotension. Feels better with IVF and meds. Ambulating prior to DC and steady on feet. Outpt GYN FU.     Raeford Razor, MD 07/05/14 [id number]

## 2014-07-01 NOTE — ED Notes (Signed)
Assessment and explanation of plan of care done with nepalese translator via translator phone

## 2014-07-04 ENCOUNTER — Inpatient Hospital Stay (HOSPITAL_COMMUNITY)
Admission: AD | Admit: 2014-07-04 | Discharge: 2014-07-05 | Disposition: A | Discharge status: Home / Self Care | Payer: Medicaid Other | Source: Ambulatory Visit | Attending: Obstetrics and Gynecology | Admitting: Obstetrics and Gynecology

## 2014-07-04 ENCOUNTER — Encounter (HOSPITAL_COMMUNITY): Payer: Self-pay | Admitting: *Deleted

## 2014-07-04 DIAGNOSIS — Z3A09 9 weeks gestation of pregnancy: Secondary | ICD-10-CM | POA: Diagnosis not present

## 2014-07-04 DIAGNOSIS — O219 Vomiting of pregnancy, unspecified: Secondary | ICD-10-CM

## 2014-07-04 DIAGNOSIS — O210 Mild hyperemesis gravidarum: Secondary | ICD-10-CM | POA: Insufficient documentation

## 2014-07-04 DIAGNOSIS — O99611 Diseases of the digestive system complicating pregnancy, first trimester: Secondary | ICD-10-CM | POA: Insufficient documentation

## 2014-07-04 DIAGNOSIS — K120 Recurrent oral aphthae: Secondary | ICD-10-CM | POA: Insufficient documentation

## 2014-07-04 DIAGNOSIS — T50905A Adverse effect of unspecified drugs, medicaments and biological substances, initial encounter: Secondary | ICD-10-CM | POA: Diagnosis not present

## 2014-07-04 DIAGNOSIS — K219 Gastro-esophageal reflux disease without esophagitis: Secondary | ICD-10-CM | POA: Insufficient documentation

## 2014-07-04 LAB — URINALYSIS, ROUTINE W REFLEX MICROSCOPIC
Bilirubin Urine: NEGATIVE
GLUCOSE, UA: NEGATIVE mg/dL
HGB URINE DIPSTICK: NEGATIVE
KETONES UR: 15 mg/dL — AB
Leukocytes, UA: NEGATIVE
NITRITE: NEGATIVE
Protein, ur: NEGATIVE mg/dL
Specific Gravity, Urine: 1.015 (ref 1.005–1.030)
UROBILINOGEN UA: 0.2 mg/dL (ref 0.0–1.0)
pH: 6 (ref 5.0–8.0)

## 2014-07-04 MED ORDER — LACTATED RINGERS IV BOLUS (SEPSIS)
1000.0000 mL | Freq: Once | INTRAVENOUS | Status: AC
  Administered 2014-07-04: 1000 mL via INTRAVENOUS

## 2014-07-04 MED ORDER — MAGIC MOUTHWASH
5.0000 mL | Freq: Three times a day (TID) | ORAL | Status: DC | PRN
Start: 2014-07-04 — End: 2015-02-22

## 2014-07-04 MED ORDER — PROMETHAZINE HCL 12.5 MG PO TABS: 12.5000 mg | ORAL_TABLET | Freq: Four times a day (QID) | ORAL | Status: DC | PRN

## 2014-07-04 MED ORDER — PROMETHAZINE HCL 12.5 MG RE SUPP
12.5000 mg | Freq: Four times a day (QID) | RECTAL | Status: DC | PRN
Start: 2014-07-04 — End: 2014-07-04

## 2014-07-04 MED ORDER — DOXYLAMINE-PYRIDOXINE 10-10 MG PO TBEC: 1.0000 | DELAYED_RELEASE_TABLET | Freq: Two times a day (BID) | ORAL | Status: DC

## 2014-07-04 MED ORDER — PANTOPRAZOLE SODIUM 40 MG PO TBEC: 40.0000 mg | DELAYED_RELEASE_TABLET | Freq: Every day | ORAL | Status: DC

## 2014-07-04 MED ORDER — LACTATED RINGERS IV SOLN
25.0000 mg | Freq: Once | INTRAVENOUS | Status: AC
  Administered 2014-07-04: 25 mg via INTRAVENOUS
  Filled 2014-07-04: qty 1

## 2014-07-04 MED ORDER — DIPHENHYDRAMINE HCL 50 MG/ML IJ SOLN
25.0000 mg | Freq: Once | INTRAMUSCULAR | Status: AC
  Administered 2014-07-04: 25 mg via INTRAVENOUS
  Filled 2014-07-04: qty 1

## 2014-07-04 MED ORDER — FAMOTIDINE IN NACL 20-0.9 MG/50ML-% IV SOLN
20.0000 mg | Freq: Once | INTRAVENOUS | Status: DC
  Filled 2014-07-04: qty 50

## 2014-07-04 NOTE — MAU Provider Note (Signed)
Chief Complaint: Morning Sickness and Dizziness   Nepalese interpreter via Language Line   SUBJECTIVE HPI: Kayla Bender is a 24 y.o. G3P1011 at [redacted]w[redacted]d who presents with persistent nausea, vomiting and sharp epigastric pain for 1 week. Retaining no solids, little fluids. Seen in ED 07/01/13 and given Zofran. Took it for 2 days and stopped because of vomiting after taking the med. Feels tired and dizzy.  Also has recurrent sores in mouth and on lip.  NPC. Has [facility]. Seen 05/12/14 for H/A and mouth lesions.  Past Medical History  Diagnosis Date  . GERD (gastroesophageal reflux disease)    OB History  Gravida Para Term Preterm AB SAB TAB Ectopic Multiple Living  3             # Outcome Date GA Lbr Len/2nd Weight Sex Delivery Anes PTL Lv  3 Current           2 Gravida           1 Gravida              Past Surgical History  Procedure Laterality Date  . Cesarean section    . Esophagogastroduodenoscopy N/A 12/05/2012    Procedure: ESOPHAGOGASTRODUODENOSCOPY (EGD);  Surgeon: Petra Kuba, MD;  [facility] END[facility]COPY;  Service: Endoscopy;  Laterality: N/A;   History   Social History  . Marital Status: Married    Spouse Name: N/A    Number of Children: N/A  . Years of Education: N/A   Occupational History  . Not on file.   Social History Main Topics  . Smoking status: Never Smoker   . Smokeless tobacco: Not on file  . Alcohol Use: No  . Drug Use: No  . Sexual Activity: Yes    Birth Control/ Protection: None   Other Topics Concern  . Not on file   Social History Narrative   No current facility-administered medications on file prior to encounter.   Current Outpatient Prescriptions on File Prior to Encounter  Medication Sig Dispense Refill  . ondansetron (ZOFRAN) 4 MG tablet Take 1 tablet (4 mg total) by mouth every 6 (six) hours. 12 tablet 0   No Known Allergies  ROS: Pertinent items in HPI  OBJECTIVE Blood pressure 106/51, pulse 70, temperature 99.1 F (37.3 C),  temperature source Oral, resp. rate 18, height 5' (1.524 m), weight 48.535 kg (107 lb), last menstrual period 05/01/2014.  Filed Vitals:   07/04/14 1707  BP: 106/51  Pulse: 70  Temp: 99.1 F (37.3 C)  TempSrc: Oral  Resp: 18  Height: 5' (1.524 m)  Weight: 48.535 kg (107 lb)  Orthostatics: BP stable. Pulse increased from 65 lying >92 standing  GENERAL: Thin female, looks fatigued HEENT: Normocephalic. Mouth with vesicular lesion left upper lip and 3mm apthous ulcer left upper buccal mucosa HEART: normal rate RESP: normal effort ABDOMEN: Soft, non-tender EXTREMITIES: Nontender, no edema NEURO: Alert and oriented   LAB RESULTS Results for orders placed or performed during the hospital encounter of 07/04/14 (from the past 24 hour(s))  Urinalysis, Routine w reflex microscopic     Status: Abnormal   Collection Time: 07/04/14  5:00 PM  Result Value Ref Range   Color, Urine YELLOW YELLOW   APPearance CLEAR CLEAR   Specific Gravity, Urine 1.015 1.005 - 1.030   pH 6.0 5.0 - 8.0   Glucose, UA NEGATIVE NEGATIVE mg/dL   Hgb urine dipstick NEGATIVE NEGATIVE   Bilirubin Urine NEGATIVE NEGATIVE   Ketones, ur 15 (A) NEGATIVE  mg/dL   Protein, ur NEGATIVE NEGATIVE mg/dL   Urobilinogen, UA 0.2 0.0 - 1.0 mg/dL   Nitrite NEGATIVE NEGATIVE   Leukocytes, UA NEGATIVE NEGATIVE    IMAGING No results found.  MAU COURSE LR1000 with Phenergan 25 mg; Pepcid 40mg  IVPB 2000: After most of the liter infused and pt had slept, she began having agitation, confusion, feeling like she's "unconscious". D/W Pharmacist> IV fluids and Benadryl 25mg  IVPB 2315 After second liter with IV Benadryl> feels better, somewhat somnolent, retained crackers  ASSESSMENT 1. Nausea and vomiting during pregnancy prior to [redacted] weeks gestation   2. Aphthous stomatitis   3. Gastroesophageal reflux disease, esophagitis presence not specified   4. Adverse drug reaction, initial encounter   G3P1011 at 8083w3d  PLAN Discharge  home Advised to avoid Phenergan or try at lower dose    Medication List    TAKE these medications        Doxylamine-Pyridoxine 10-10 MG Tbec  Commonly known as:  DICLEGIS  Take 1 tablet by mouth 2 (two) times daily.     magic mouthwash Soln  Take 5 mLs by mouth 3 (three) times daily as needed for mouth pain.     ondansetron 4 MG tablet  Commonly known as:  ZOFRAN  Take 1 tablet (4 mg total) by mouth every 6 (six) hours.     pantoprazole 40 MG tablet  Commonly known as:  PROTONIX  Take 1 tablet (40 mg total) by mouth daily.     prenatal multivitamin Tabs tablet  Take 1 tablet by mouth daily at 12 noon.         Follow-up Information    [facility] [place]. Schedule an appointment as soon as possible for a visit in 1 week.   Contact information:   [address] [place] [id number] [phone]       Danae OrleansDeirdre C Poe, CNM 07/04/2014  6:09 PM

## 2014-07-04 NOTE — Progress Notes (Signed)
Benadryl given and plain LR given  to pt.

## 2014-07-04 NOTE — Discharge Instructions (Signed)
Canker Sores  Canker sores are painful, open sores on the inside of the mouth and cheek. They may be white or yellow. The sores usually heal in 1 to 2 weeks. Women are more likely than men to have recurrent canker sores. CAUSES The cause of canker sores is not well understood. More than one cause is likely. Canker sores do not appear to be caused by certain types of germs (viruses or bacteria). Canker sores may be caused by:  An allergic reaction to certain foods.  Digestive problems.  Not having enough vitamin B12, folic acid, and iron.  Female sex hormones. Sores may come only during certain phases of a menstrual cycle. Often, there is improvement during pregnancy.  Genetics. Some people seem to inherit canker sore problems. Emotional stress and injuries to the mouth may trigger outbreaks, but not cause them.  DIAGNOSIS Canker sores are diagnosed by exam.  TREATMENT  Patients who have frequent bouts of canker sores may have cultures taken of the sores, blood tests, or allergy tests. This helps determine if their sores are caused by a poor diet, an allergy, or some other preventable or treatable disease.  Vitamins may prevent recurrences or reduce the severity of canker sores in people with poor nutrition.  Numbing ointments can relieve pain. These are available in drug stores without a prescription.  Anti-inflammatory steroid mouth rinses or gels may be prescribed by your caregiver for severe sores.  Oral steroids may be prescribed if you have severe, recurrent canker sores. These strong medicines can cause many side effects and should be used only under the close direction of a dentist or physician.  Mouth rinses containing the antibiotic medicine may be prescribed. They may lessen symptoms and speed healing. Healing usually happens in about 1 or 2 weeks with or without treatment. Certain antibiotic mouth rinses given to pregnant women and young children can permanently stain  teeth. Talk to your caregiver about your treatment. HOME CARE INSTRUCTIONS   Avoid foods that cause canker sores for you.  Avoid citrus juices, spicy or salty foods, and coffee until the sores are healed.  Use a soft-bristled toothbrush.  Chew your food carefully to avoid biting your cheek.  Apply topical numbing medicine to the sore to help relieve pain.  Apply a thin paste of baking soda and water to the sore to help heal the sore.  Only use mouth rinses or medicines for pain or discomfort as directed by your caregiver. SEEK MEDICAL CARE IF:   Your symptoms are not better in 1 week.  Your sores are still present after 2 weeks.  Your sores are very painful.  You have trouble breathing or swallowing.  Your sores come back frequently. Document Released: 10/08/2010 Document Revised: 10/08/2012 Document Reviewed: 10/08/2010 [facility] Patient Information 2015 [place], [place]. This information is not intended to replace advice given to you by your health care provider. Make sure you discuss any questions you have with your health care provider. Gastroesophageal Reflux Disease, Adult Gastroesophageal reflux disease (GERD) happens when acid from your stomach flows up into the esophagus. When acid comes in contact with the esophagus, the acid causes soreness (inflammation) in the esophagus. Over time, GERD may create small holes (ulcers) in the lining of the esophagus. CAUSES   Increased body weight. This puts pressure on the stomach, making acid rise from the stomach into the esophagus.  Smoking. This increases acid production in the stomach.  Drinking alcohol. This causes decreased pressure in the lower esophageal sphincter (  valve or ring of muscle between the esophagus and stomach), allowing acid from the stomach into the esophagus.  Late evening meals and a full stomach. This increases pressure and acid production in the stomach.  A malformed lower esophageal  sphincter. Sometimes, no cause is found. SYMPTOMS   Burning pain in the lower part of the mid-chest behind the breastbone and in the mid-stomach area. This may occur twice a week or more often.  Trouble swallowing.  Sore throat.  Dry cough.  Asthma-like symptoms including chest tightness, shortness of breath, or wheezing. DIAGNOSIS  Your caregiver may be able to diagnose GERD based on your symptoms. In some cases, X-rays and other tests may be done to check for complications or to check the condition of your stomach and esophagus. TREATMENT  Your caregiver may recommend over-the-counter or prescription medicines to help decrease acid production. Ask your caregiver before starting or adding any new medicines.  HOME CARE INSTRUCTIONS   Change the factors that you can control. Ask your caregiver for guidance concerning weight loss, quitting smoking, and alcohol consumption.  Avoid foods and drinks that make your symptoms worse, such as:  Caffeine or alcoholic drinks.  Chocolate.  Peppermint or mint flavorings.  Garlic and onions.  Spicy foods.  Citrus fruits, such as oranges, lemons, or limes.  Tomato-based foods such as sauce, chili, salsa, and pizza.  Fried and fatty foods.  Avoid lying down for the 3 hours prior to your bedtime or prior to taking a nap.  Eat small, frequent meals instead of large meals.  Wear loose-fitting clothing. Do not wear anything tight around your waist that causes pressure on your stomach.  Raise the head of your bed 6 to 8 inches with wood blocks to help you sleep. Extra pillows will not help.  Only take over-the-counter or prescription medicines for pain, discomfort, or fever as directed by your caregiver.  Do not take aspirin, ibuprofen, or other nonsteroidal anti-inflammatory drugs (NSAIDs). SEEK IMMEDIATE MEDICAL CARE IF:   You have pain in your arms, neck, jaw, teeth, or back.  Your pain increases or changes in intensity or  duration.  You develop nausea, vomiting, or sweating (diaphoresis).  You develop shortness of breath, or you faint.  Your vomit is green, yellow, black, or looks like coffee grounds or blood.  Your stool is red, bloody, or black. These symptoms could be signs of other problems, such as heart disease, gastric bleeding, or esophageal bleeding. MAKE SURE YOU:   Understand these instructions.  Will watch your condition.  Will get help right away if you are not doing well or get worse. Document Released: 03/23/2005 Document Revised: 09/05/2011 Document Reviewed: 12/31/2010 [facility] Patient Information 2015 [place], [place]. This information is not intended to replace advice given to you by your health care provider. Make sure you discuss any questions you have with your health care provider. Morning Sickness Morning sickness is when you feel sick to your stomach (nauseous) during pregnancy. This nauseous feeling may or may not come with vomiting. It often occurs in the morning but can be a problem any time of day. Morning sickness is most common during the first trimester, but it may continue throughout pregnancy. While morning sickness is unpleasant, it is usually harmless unless you develop severe and continual vomiting (hyperemesis gravidarum). This condition requires more intense treatment.  CAUSES  The cause of morning sickness is not completely known but seems to be related to normal hormonal changes that occur in pregnancy. RISK FACTORS  You are at greater risk if you:  Experienced nausea or vomiting before your pregnancy.  Had morning sickness during a previous pregnancy.  Are pregnant with more than one baby, such as twins. TREATMENT  Do not use any medicines (prescription, over-the-counter, or herbal) for morning sickness without first talking to your health care provider. Your health care provider may prescribe or recommend:  Vitamin B6 supplements.  Anti-nausea  medicines.  The herbal medicine ginger. HOME CARE INSTRUCTIONS   Only take over-the-counter or prescription medicines as directed by your health care provider.  Taking multivitamins before getting pregnant can prevent or decrease the severity of morning sickness in most women.  Eat a piece of dry toast or unsalted crackers before getting out of bed in the morning.  Eat five or six small meals a day.  Eat dry and bland foods (rice, baked potato). Foods high in carbohydrates are often helpful.  Do not drink liquids with your meals. Drink liquids between meals.  Avoid greasy, fatty, and spicy foods.  Get someone to cook for you if the smell of any food causes nausea and vomiting.  If you feel nauseous after taking prenatal vitamins, take the vitamins at night or with a snack.  Snack on protein foods (nuts, yogurt, cheese) between meals if you are hungry.  Eat unsweetened gelatins for desserts.  Wearing an acupressure wristband (worn for sea sickness) may be helpful.  Acupuncture may be helpful.  Do not smoke.  Get a humidifier to keep the air in your house free of odors.  Get plenty of fresh air. SEEK MEDICAL CARE IF:   Your home remedies are not working, and you need medicine.  You feel dizzy or lightheaded.  You are losing weight. SEEK IMMEDIATE MEDICAL CARE IF:   You have persistent and uncontrolled nausea and vomiting.  You pass out (faint). MAKE SURE YOU:  Understand these instructions.  Will watch your condition.  Will get help right away if you are not doing well or get worse. Document Released: 08/04/2006 Document Revised: 06/18/2013 Document Reviewed: 11/28/2012 [facility] Patient Information 2015 [place]. This information is not intended to replace advice given to you by your health care provider. Make sure you discuss any questions you have with your health care provider.

## 2014-07-04 NOTE — Progress Notes (Signed)
Pt better. Jerky movements much less pt more awake and less restless. Assisted pt to BR. Requesting something to eat crackers given.

## 2014-07-04 NOTE — Progress Notes (Signed)
Pt got to go to the BR and after she got back to bed started feeling restless and agitated. Legs and arms became jerky. Called D.Poe,CNm to bedside and she observed the same thing. V/SS. Phenergan infusion stopped. Notified Pharmacy.

## 2014-07-04 NOTE — Progress Notes (Signed)
Pt still having extrapyramidal symptoms. Legs and arms still jerking uncontrollably every few minutes. Family at bedside. Continue to observe..Marland Kitchen

## 2014-07-04 NOTE — MAU Note (Signed)
Pt presents to MAU with complaints of nausea and vomiting, tiredness and dizziness for approximately two weeks. Denies any vaginal bleeding

## 2014-07-05 NOTE — Progress Notes (Signed)
Pt feeling better. Tired but extrapyramidal symptoms have dissapated and no more nausea. Instructions and follow up instruction given to pt and husband via interpreter. Pt D/C'd home.

## 2014-07-11 ENCOUNTER — Encounter: Payer: Self-pay | Admitting: *Deleted

## 2014-07-20 ENCOUNTER — Encounter (HOSPITAL_COMMUNITY): Payer: Self-pay | Admitting: *Deleted

## 2014-07-20 ENCOUNTER — Emergency Department (HOSPITAL_COMMUNITY)
Admission: EM | Admit: 2014-07-20 | Discharge: 2014-07-20 | Disposition: A | Discharge status: Home / Self Care | Payer: Medicaid Other | Attending: Emergency Medicine | Admitting: Emergency Medicine

## 2014-07-20 DIAGNOSIS — O98511 Other viral diseases complicating pregnancy, first trimester: Secondary | ICD-10-CM | POA: Diagnosis not present

## 2014-07-20 DIAGNOSIS — E860 Dehydration: Secondary | ICD-10-CM | POA: Insufficient documentation

## 2014-07-20 DIAGNOSIS — O210 Mild hyperemesis gravidarum: Secondary | ICD-10-CM | POA: Diagnosis not present

## 2014-07-20 DIAGNOSIS — Z3A12 12 weeks gestation of pregnancy: Secondary | ICD-10-CM | POA: Insufficient documentation

## 2014-07-20 DIAGNOSIS — B001 Herpesviral vesicular dermatitis: Secondary | ICD-10-CM | POA: Insufficient documentation

## 2014-07-20 DIAGNOSIS — O99611 Diseases of the digestive system complicating pregnancy, first trimester: Secondary | ICD-10-CM | POA: Diagnosis not present

## 2014-07-20 DIAGNOSIS — K219 Gastro-esophageal reflux disease without esophagitis: Secondary | ICD-10-CM | POA: Insufficient documentation

## 2014-07-20 DIAGNOSIS — Z79899 Other long term (current) drug therapy: Secondary | ICD-10-CM | POA: Insufficient documentation

## 2014-07-20 DIAGNOSIS — O9989 Other specified diseases and conditions complicating pregnancy, childbirth and the puerperium: Secondary | ICD-10-CM | POA: Diagnosis present

## 2014-07-20 DIAGNOSIS — R51 Headache: Secondary | ICD-10-CM | POA: Insufficient documentation

## 2014-07-20 DIAGNOSIS — O99281 Endocrine, nutritional and metabolic diseases complicating pregnancy, first trimester: Secondary | ICD-10-CM | POA: Insufficient documentation

## 2014-07-20 LAB — CBC WITH DIFFERENTIAL/PLATELET
BASOS ABS: 0 10*3/uL (ref 0.0–0.1)
BASOS PCT: 0 % (ref 0–1)
EOS PCT: 1 % (ref 0–5)
Eosinophils Absolute: 0 10*3/uL (ref 0.0–0.7)
HCT: 31.1 % — ABNORMAL LOW (ref 36.0–46.0)
Hemoglobin: 11 g/dL — ABNORMAL LOW (ref 12.0–15.0)
Lymphocytes Relative: 28 % (ref 12–46)
Lymphs Abs: 2 10*3/uL (ref 0.7–4.0)
MCH: 30.6 pg (ref 26.0–34.0)
MCHC: 35.4 g/dL (ref 30.0–36.0)
MCV: 86.4 fL (ref 78.0–100.0)
MONO ABS: 0.4 10*3/uL (ref 0.1–1.0)
Monocytes Relative: 6 % (ref 3–12)
Neutro Abs: 4.7 10*3/uL (ref 1.7–7.7)
Neutrophils Relative %: 65 % (ref 43–77)
PLATELETS: 161 10*3/uL (ref 150–400)
RBC: 3.6 MIL/uL — ABNORMAL LOW (ref 3.87–5.11)
RDW: 12.2 % (ref 11.5–15.5)
WBC: 7.2 10*3/uL (ref 4.0–10.5)

## 2014-07-20 LAB — COMPREHENSIVE METABOLIC PANEL
ALT: 29 U/L (ref 0–35)
AST: 35 U/L (ref 0–37)
Albumin: 3.6 g/dL (ref 3.5–5.2)
Alkaline Phosphatase: 40 U/L (ref 39–117)
Anion gap: 6 (ref 5–15)
BUN: 9 mg/dL (ref 6–23)
CALCIUM: 9.2 mg/dL (ref 8.4–10.5)
CHLORIDE: 104 mmol/L (ref 96–112)
CO2: 25 mmol/L (ref 19–32)
Creatinine, Ser: 0.46 mg/dL — ABNORMAL LOW (ref 0.50–1.10)
GFR calc non Af Amer: >90 mL/min (ref >90)
GLUCOSE: 105 mg/dL — AB (ref 70–99)
Potassium: 3.7 mmol/L (ref 3.5–5.1)
SODIUM: 135 mmol/L (ref 135–145)
TOTAL PROTEIN: 6.7 g/dL (ref 6.0–8.3)
Total Bilirubin: 0.6 mg/dL (ref 0.3–1.2)

## 2014-07-20 LAB — URINALYSIS, ROUTINE W REFLEX MICROSCOPIC
Bilirubin Urine: NEGATIVE
GLUCOSE, UA: NEGATIVE mg/dL
Hgb urine dipstick: NEGATIVE
Ketones, ur: NEGATIVE mg/dL
Nitrite: NEGATIVE
Protein, ur: NEGATIVE mg/dL
SPECIFIC GRAVITY, URINE: 1.009 (ref 1.005–1.030)
UROBILINOGEN UA: 1 mg/dL (ref 0.0–1.0)
pH: 6 (ref 5.0–8.0)

## 2014-07-20 LAB — URINE MICROSCOPIC-ADD ON

## 2014-07-20 LAB — PREGNANCY, URINE: Preg Test, Ur: POSITIVE — AB

## 2014-07-20 MED ORDER — SODIUM CHLORIDE 0.9 % IV BOLUS (SEPSIS)
1000.0000 mL | Freq: Once | INTRAVENOUS | Status: AC
  Administered 2014-07-20: 1000 mL via INTRAVENOUS

## 2014-07-20 MED ORDER — ONDANSETRON HCL 4 MG/2ML IJ SOLN
4.0000 mg | Freq: Once | INTRAMUSCULAR | Status: AC
  Administered 2014-07-20: 4 mg via INTRAVENOUS
  Filled 2014-07-20: qty 2

## 2014-07-20 MED ORDER — ACYCLOVIR 400 MG PO TABS: 400.0000 mg | ORAL_TABLET | Freq: Three times a day (TID) | ORAL | Status: DC

## 2014-07-20 MED ORDER — ONDANSETRON HCL 4 MG PO TABS: 4.0000 mg | ORAL_TABLET | Freq: Four times a day (QID) | ORAL | Status: DC

## 2014-07-20 NOTE — ED Provider Notes (Addendum)
CSN: [id number]     Arrival date & time 07/20/14  1151 History   First MD Initiated Contact with Patient 07/20/14 1205     Chief Complaint  Patient presents with  . Rash  . Headache     (Consider location/radiation/quality/duration/timing/severity/associated sxs/prior Treatment) The history is provided by the patient. A language interpreter was used Multimedia programmer([employer]).  Kayla Bender is a 24 y.o. female 2 months pregnant here presenting with hyperemesis, headache, dizziness, vomiting, mouth sores. She has been having vomiting intermittently for the last 3 weeks. Came to ER on January 5 and was given Zofran. She then had more vomiting so went to [facility] and ended up having extrapyramidal symptoms to Phenergan so was observed overnight and then was discharged on Zofran. She had some mouth sores and had herpes swab done that was negative. She hasn't been on any antivirals. She felt better until yesterday when she started vomiting again. Some intermittent headaches as well as dizziness and lightheadedness. She also noticed that she had worsening mouth sores.  Has some intermittent crampy abdominal pain but denies any vaginal bleeding or discharge.   Past Medical History  Diagnosis Date  . GERD (gastroesophageal reflux disease)    Past Surgical History  Procedure Laterality Date  . Cesarean section    . Esophagogastroduodenoscopy N/A 12/05/2012    Procedure: ESOPHAGOGASTRODUODENOSCOPY (EGD);  Surgeon: Petra KubaMarc E Magod, MD;  Location: [facility] END[facility]COPY;  Service: Endoscopy;  Laterality: N/A;   History reviewed. No pertinent family history. History  Substance Use Topics  . Smoking status: Never Smoker   . Smokeless tobacco: Not on file  . Alcohol Use: No   OB History    Gravida Para Term Preterm AB TAB SAB Ectopic Multiple Living   3              Review of Systems  Gastrointestinal: Positive for vomiting.  Skin: Positive for rash.  Neurological: Positive for dizziness and headaches.  All  other systems reviewed and are negative.     Allergies  Promethazine  Home Medications   Prior to Admission medications   Medication Sig Start Date End Date Taking? Authorizing Provider  Alum & Mag Hydroxide-Simeth (MAGIC MOUTHWASH) SOLN Take 5 mLs by mouth 3 (three) times daily as needed for mouth pain. 07/04/14   Deirdre Colin Mulders Poe, CNM  Doxylamine-Pyridoxine (DICLEGIS) 10-10 MG TBEC Take 1 tablet by mouth 2 (two) times daily. 07/04/14   Deirdre C Poe, CNM  ondansetron (ZOFRAN) 4 MG tablet Take 1 tablet (4 mg total) by mouth every 6 (six) hours. 07/01/14   Raeford RazorStephen Kohut, MD  pantoprazole (PROTONIX) 40 MG tablet Take 1 tablet (40 mg total) by mouth daily. 07/04/14   Deirdre Colin Mulders Poe, CNM  Prenatal Vit-Fe Fumarate-FA (PRENATAL MULTIVITAMIN) TABS tablet Take 1 tablet by mouth daily at 12 noon.    Historical Provider, MD   BP 95/52 mmHg  Pulse 81  Temp(Src) 98 F (36.7 C) (Oral)  Resp 18  SpO2 100%  LMP 05/01/2014 Physical Exam  Constitutional: She is oriented to person, place, and time.  Dehydrated   HENT:  Head: Normocephalic.  + vesicular lesions on lips. No oral thrush. No oral lesions. MM slightly dry   Eyes: Conjunctivae and EOM are normal. Pupils are equal, round, and reactive to light.  Neck: Normal range of motion. Neck supple.  Cardiovascular: Normal rate, regular rhythm and normal heart sounds.   Pulmonary/Chest: Effort normal and breath sounds normal. No respiratory distress. She has no wheezes. She has  no rales.  Abdominal: Soft. Bowel sounds are normal. She exhibits no distension. There is no tenderness. There is no rebound and no guarding.  Musculoskeletal: Normal range of motion. She exhibits no edema or tenderness.  Neurological: She is alert and oriented to person, place, and time. No cranial nerve deficit. Coordination normal.  Skin: Skin is warm and dry.  Only lip lesions, no other rash   Psychiatric: She has a normal mood and affect. Her behavior is normal. Judgment and  thought content normal.  Nursing note and vitals reviewed.   ED Course  Procedures (including critical care time)  EMERGENCY DEPARTMENT [place] PREGNANCY "Study: Limited Ultrasound of the Pelvis"  INDICATIONS:Pregnancy(required) Multiple views of the uterus and pelvic cavity are obtained with a multi-frequency probe.  APPROACH:Transabdominal   PERFORMED BY: Myself  IMAGES ARCHIVED?: Yes  LIMITATIONS: Emergent procedure  PREGNANCY FREE FLUID: Medium  PREGNANCY UTERUS FINDINGS:Uterus enlarged ADNEXAL FINDINGS:Left ovary not seen and Right ovary not seen  PREGNANCY FINDINGS: Intrauterine gestational sac noted, Yolk sac noted, Fetal pole present and Fetal heart activity seen  INTERPRETATION: Viable intrauterine pregnancy  GESTATIONAL AGE, ESTIMATE: 12 weeks  FETAL HEART RATE: 188  COMMENT(Estimate of Gestational Age):  12 weeks      Labs Review Labs Reviewed  CBC WITH DIFFERENTIAL/PLATELET - Abnormal; Notable for the following:    RBC 3.60 (*)    Hemoglobin 11.0 (*)    HCT 31.1 (*)    All other components within normal limits  COMPREHENSIVE METABOLIC PANEL - Abnormal; Notable for the following:    Glucose, Bld 105 (*)    Creatinine, Ser 0.46 (*)    All other components within normal limits  URINALYSIS, ROUTINE W REFLEX MICROSCOPIC - Abnormal; Notable for the following:    Leukocytes, UA TRACE (*)    All other components within normal limits  PREGNANCY, URINE - Abnormal; Notable for the following:    Preg Test, Ur POSITIVE (*)    All other components within normal limits  URINE MICROSCOPIC-ADD ON    Imaging Review No results found.   EKG Interpretation None      MDM   Final diagnoses:  None   Kayla Bender is a 24 y.o. female here with hyperemesis, mouth sores. 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 Will check labs, UA. Bedside US showed IUP with reassuring heart rate. No vaginal bleeding to suggest miscarriage.   4:16 PM Labs unremarkable.  UA showed no ketones after 1 L NS. Tolerated PO fluids and meal. Will dc home with zofran, acyclovir (for recurrent herpes). BP mid 90s but that is baseline for her. No signs of sepsis and she is well hydrated now.     Richardean Canal, MD 07/20/14 1617  Richardean Canal, MD 07/20/14 [phone]

## 2014-07-20 NOTE — ED Notes (Signed)
MD at bedside. 

## 2014-07-20 NOTE — ED Notes (Signed)
PT received water per Dr. Silverio LayYao.

## 2014-07-20 NOTE — ED Notes (Signed)
PT and family given water.

## 2014-07-20 NOTE — ED Notes (Signed)
Multiple complaints. Reports being approx 2 months pregnant. Pt having fatigue, n/v, headache and dizziness. Has bumps/rash to face.

## 2014-07-20 NOTE — ED Notes (Signed)
Meal Tray ordered.  

## 2014-07-20 NOTE — Discharge Instructions (Signed)
Stay hydrated.   Take zofran for nausea.   Take acyclovir three times daily for a week.   Follow up with OB doctor.   [facility]   Return to ER if you have severe vomiting, dehydration, worse cold sore.

## 2014-07-29 ENCOUNTER — Inpatient Hospital Stay (HOSPITAL_COMMUNITY)
Admission: AD | Admit: 2014-07-29 | Discharge: 2014-07-30 | Disposition: A | Discharge status: Home / Self Care | Payer: Medicaid Other | Source: Ambulatory Visit | Attending: Family Medicine | Admitting: Family Medicine

## 2014-07-29 DIAGNOSIS — O210 Mild hyperemesis gravidarum: Secondary | ICD-10-CM | POA: Diagnosis not present

## 2014-07-29 DIAGNOSIS — M549 Dorsalgia, unspecified: Secondary | ICD-10-CM

## 2014-07-29 DIAGNOSIS — O99891 Other specified diseases and conditions complicating pregnancy: Secondary | ICD-10-CM

## 2014-07-29 DIAGNOSIS — Z3A12 12 weeks gestation of pregnancy: Secondary | ICD-10-CM | POA: Diagnosis not present

## 2014-07-29 DIAGNOSIS — O99611 Diseases of the digestive system complicating pregnancy, first trimester: Secondary | ICD-10-CM | POA: Diagnosis not present

## 2014-07-29 DIAGNOSIS — M545 Low back pain: Secondary | ICD-10-CM | POA: Insufficient documentation

## 2014-07-29 DIAGNOSIS — O9989 Other specified diseases and conditions complicating pregnancy, childbirth and the puerperium: Secondary | ICD-10-CM

## 2014-07-29 DIAGNOSIS — K219 Gastro-esophageal reflux disease without esophagitis: Secondary | ICD-10-CM | POA: Insufficient documentation

## 2014-07-29 DIAGNOSIS — O219 Vomiting of pregnancy, unspecified: Secondary | ICD-10-CM

## 2014-07-29 LAB — URINE MICROSCOPIC-ADD ON

## 2014-07-29 LAB — URINALYSIS, ROUTINE W REFLEX MICROSCOPIC
BILIRUBIN URINE: NEGATIVE
Glucose, UA: NEGATIVE mg/dL
Hgb urine dipstick: NEGATIVE
Ketones, ur: NEGATIVE mg/dL
Nitrite: NEGATIVE
Protein, ur: NEGATIVE mg/dL
SPECIFIC GRAVITY, URINE: 1.025 (ref 1.005–1.030)
Urobilinogen, UA: 0.2 mg/dL (ref 0.0–1.0)
pH: 6 (ref 5.0–8.0)

## 2014-07-29 NOTE — MAU Note (Signed)
Pt reports dizziness, vomiting and nausea. Also reports lower abd pain. Symptoms have been present since positive pregnancy test.

## 2014-07-30 ENCOUNTER — Encounter (HOSPITAL_COMMUNITY): Payer: Self-pay | Admitting: *Deleted

## 2014-07-30 DIAGNOSIS — O26891 Other specified pregnancy related conditions, first trimester: Secondary | ICD-10-CM

## 2014-07-30 DIAGNOSIS — Z3A13 13 weeks gestation of pregnancy: Secondary | ICD-10-CM

## 2014-07-30 DIAGNOSIS — M549 Dorsalgia, unspecified: Secondary | ICD-10-CM

## 2014-07-30 DIAGNOSIS — O219 Vomiting of pregnancy, unspecified: Secondary | ICD-10-CM

## 2014-07-30 LAB — COMPREHENSIVE METABOLIC PANEL
ALT: 15 U/L (ref 0–35)
ANION GAP: 4 — AB (ref 5–15)
AST: 19 U/L (ref 0–37)
Albumin: 3.4 g/dL — ABNORMAL LOW (ref 3.5–5.2)
Alkaline Phosphatase: 33 U/L — ABNORMAL LOW (ref 39–117)
BUN: 6 mg/dL (ref 6–23)
CALCIUM: 9.1 mg/dL (ref 8.4–10.5)
CO2: 23 mmol/L (ref 19–32)
CREATININE: 0.38 mg/dL — AB (ref 0.50–1.10)
Chloride: 109 mmol/L (ref 96–112)
GLUCOSE: 87 mg/dL (ref 70–99)
POTASSIUM: 3.8 mmol/L (ref 3.5–5.1)
SODIUM: 136 mmol/L (ref 135–145)
Total Bilirubin: 0.3 mg/dL (ref 0.3–1.2)
Total Protein: 6.6 g/dL (ref 6.0–8.3)

## 2014-07-30 LAB — CBC WITH DIFFERENTIAL/PLATELET
BASOS ABS: 0 10*3/uL (ref 0.0–0.1)
BASOS PCT: 0 % (ref 0–1)
EOS ABS: 0.1 10*3/uL (ref 0.0–0.7)
Eosinophils Relative: 1 % (ref 0–5)
HCT: 30.6 % — ABNORMAL LOW (ref 36.0–46.0)
Hemoglobin: 11 g/dL — ABNORMAL LOW (ref 12.0–15.0)
Lymphocytes Relative: 41 % (ref 12–46)
Lymphs Abs: 3.1 10*3/uL (ref 0.7–4.0)
MCH: 31.4 pg (ref 26.0–34.0)
MCHC: 35.9 g/dL (ref 30.0–36.0)
MCV: 87.4 fL (ref 78.0–100.0)
MONO ABS: 0.4 10*3/uL (ref 0.1–1.0)
Monocytes Relative: 6 % (ref 3–12)
NEUTROS ABS: 3.9 10*3/uL (ref 1.7–7.7)
Neutrophils Relative %: 52 % (ref 43–77)
Platelets: 153 10*3/uL (ref 150–400)
RBC: 3.5 MIL/uL — AB (ref 3.87–5.11)
RDW: 12.6 % (ref 11.5–15.5)
WBC: 7.5 10*3/uL (ref 4.0–10.5)

## 2014-07-30 MED ORDER — METOCLOPRAMIDE HCL 10 MG PO TABS: 10.0000 mg | ORAL_TABLET | Freq: Four times a day (QID) | ORAL | Status: DC | PRN

## 2014-07-30 MED ORDER — ACETAMINOPHEN 500 MG PO TABS
1000.0000 mg | ORAL_TABLET | Freq: Once | ORAL | Status: AC
  Administered 2014-07-30: 1000 mg via ORAL
  Filled 2014-07-30: qty 2

## 2014-07-30 MED ORDER — METOCLOPRAMIDE HCL 10 MG PO TABS
5.0000 mg | ORAL_TABLET | Freq: Once | ORAL | Status: AC
  Administered 2014-07-30: 5 mg via ORAL
  Filled 2014-07-30: qty 1

## 2014-07-30 NOTE — MAU Provider Note (Signed)
History     CSN: [id number]  Arrival date and time: 07/29/14 2203    First Provider Initiated Contact with Patient 07/30/14 0127       Chief Complaint  Patient presents with  . Hyperemesis Gravidarum   HPI  Kayla Bender is a 24 y.o. G1P0 at [redacted]w[redacted]d who presents with multiple complaints. Used phone interpreter for [place]. -Low back pain that she states is due to standing too much at work. Started this weekend. Rates as 8/10. Has not taken medication.  -N/v that she is taking medication for. Has been prescribed Diclegis and Zofran for nausea, unclear which medication she is taking or if she ever got the zofran filled. States after taking the nausea medicine has stomach burning for 1-2 hours. Last vomited upon arrival to MAU, vomited 3 times in the last 24 hours. Last took antiemetic Tuesday morning at 11am, states that was her last pill. Patient is supposed to be on Protonix, unclear if she is currently taking for history of GERD -Denies abdominal pain/vaginal bleeding/vaginal discharge/fever.  -Reports increased urinary frequency and abdominal discomfort when she voids.    OB History    Gravida Para Term Preterm AB TAB SAB Ectopic Multiple Living   1               Past Medical History  Diagnosis Date  . GERD (gastroesophageal reflux disease)     Past Surgical History  Procedure Laterality Date  . Cesarean section    . Esophagogastroduodenoscopy N/A 12/05/2012    Procedure: ESOPHAGOGASTRODUODENOSCOPY (EGD);  Surgeon: Petra Kuba, MD;  Location: [facility] ENDOSCOPY;  Service: Endoscopy;  Laterality: N/A;    History reviewed. No pertinent family history.  History  Substance Use Topics  . Smoking status: Never Smoker   . Smokeless tobacco: Not on file  . Alcohol Use: No    Allergies:  Allergies  Allergen Reactions  . Promethazine Other (See Comments)    Extrapyramidal symptoms    Prescriptions prior to admission  Medication Sig Dispense Refill Last Dose  . acyclovir  (ZOVIRAX) 400 MG tablet Take 1 tablet (400 mg total) by mouth 3 (three) times daily. 20 tablet 0   . Alum & Mag Hydroxide-Simeth (MAGIC MOUTHWASH) SOLN Take 5 mLs by mouth 3 (three) times daily as needed for mouth pain. 15 mL 0   . Doxylamine-Pyridoxine (DICLEGIS) 10-10 MG TBEC Take 1 tablet by mouth 2 (two) times daily. 60 tablet 0   . ondansetron (ZOFRAN) 4 MG tablet Take 1 tablet (4 mg total) by mouth every 6 (six) hours. 12 tablet 0   . pantoprazole (PROTONIX) 40 MG tablet Take 1 tablet (40 mg total) by mouth daily. 30 tablet 1   . Prenatal Vit-Fe Fumarate-FA (PRENATAL MULTIVITAMIN) TABS tablet Take 1 tablet by mouth daily at 12 noon.   Past Week at Unknown time    Review of Systems  Constitutional: Negative for fever and chills.  Respiratory: Negative.   Cardiovascular: Negative.   Gastrointestinal: Positive for heartburn, nausea and vomiting. Negative for abdominal pain, diarrhea and constipation.  Genitourinary: Positive for frequency.       Negative for vaginal bleeding and discharge  Musculoskeletal: Positive for back pain.  Skin:       Positive for mouth sores  Neurological: Positive for weakness.   Physical Exam   Blood pressure 100/61, pulse 74, temperature 97.8 F (36.6 C), temperature source Oral, resp. rate 16, height 5' 0.5" (1.537 m), weight 105 lb (47.628 kg), last menstrual period 05/01/2014,  SpO2 100 %.  Physical Exam  Constitutional: She is oriented to person, place, and time. She appears well-developed and well-nourished. No distress.  HENT:  Mouth/Throat: Oral lesions present.  Ulcerative lesions x 2 on interior of lower lip. Bilateral corners of mouth with small crusted lesions.   Cardiovascular: Normal rate and regular rhythm.   Respiratory: No respiratory distress. She has no wheezes.  GI: Soft. Bowel sounds are normal. She exhibits no distension. There is no tenderness. There is no CVA tenderness.  Neurological: She is alert and oriented to person, place,  and time.  Skin: She is not diaphoretic.   Results for orders placed or performed during the hospital encounter of 07/29/14 (from the past 24 hour(s))  Urinalysis, Routine w reflex microscopic     Status: Abnormal   Collection Time: 07/29/14 10:38 PM  Result Value Ref Range   Color, Urine YELLOW YELLOW   APPearance CLEAR CLEAR   Specific Gravity, Urine 1.025 1.005 - 1.030   pH 6.0 5.0 - 8.0   Glucose, UA NEGATIVE NEGATIVE mg/dL   Hgb urine dipstick NEGATIVE NEGATIVE   Bilirubin Urine NEGATIVE NEGATIVE   Ketones, ur NEGATIVE NEGATIVE mg/dL   Protein, ur NEGATIVE NEGATIVE mg/dL   Urobilinogen, UA 0.2 0.0 - 1.0 mg/dL   Nitrite NEGATIVE NEGATIVE   Leukocytes, UA SMALL (A) NEGATIVE  Urine microscopic-add on     Status: None   Collection Time: 07/29/14 10:38 PM  Result Value Ref Range   Squamous Epithelial / LPF RARE RARE   WBC, UA 3-6 <3 WBC/hpf   RBC / HPF 0-2 <3 RBC/hpf   Bacteria, UA RARE RARE  CBC with Differential/Platelet     Status: Abnormal   Collection Time: 07/30/14  1:26 AM  Result Value Ref Range   WBC 7.5 4.0 - 10.5 K/uL   RBC 3.50 (L) 3.87 - 5.11 MIL/uL   Hemoglobin 11.0 (L) 12.0 - 15.0 g/dL   HCT 16.1 (L) 09.6 - 04.5 %   MCV 87.4 78.0 - 100.0 fL   MCH 31.4 26.0 - 34.0 pg   MCHC 35.9 30.0 - 36.0 g/dL   RDW 40.9 81.1 - 91.4 %   Platelets 153 150 - 400 K/uL   Neutrophils Relative % 52 43 - 77 %   Neutro Abs 3.9 1.7 - 7.7 K/uL   Lymphocytes Relative 41 12 - 46 %   Lymphs Abs 3.1 0.7 - 4.0 K/uL   Monocytes Relative 6 3 - 12 %   Monocytes Absolute 0.4 0.1 - 1.0 K/uL   Eosinophils Relative 1 0 - 5 %   Eosinophils Absolute 0.1 0.0 - 0.7 K/uL   Basophils Relative 0 0 - 1 %   Basophils Absolute 0.0 0.0 - 0.1 K/uL  Comprehensive metabolic panel     Status: Abnormal   Collection Time: 07/30/14  1:26 AM  Result Value Ref Range   Sodium 136 135 - 145 mmol/L   Potassium 3.8 3.5 - 5.1 mmol/L   Chloride 109 96 - 112 mmol/L   CO2 23 19 - 32 mmol/L   Glucose, Bld 87 70 -  99 mg/dL   BUN 6 6 - 23 mg/dL   Creatinine, Ser 7.82 (L) 0.50 - 1.10 mg/dL   Calcium 9.1 8.4 - 95.6 mg/dL   Total Protein 6.6 6.0 - 8.3 g/dL   Albumin 3.4 (L) 3.5 - 5.2 g/dL   AST 19 0 - 37 U/L   ALT 15 0 - 35 U/L   Alkaline Phosphatase 33 (  L) 39 - 117 U/L   Total Bilirubin 0.3 0.3 - 1.2 mg/dL   GFR calc non Af Amer >90 >90 mL/min   GFR calc Af Amer >90 >90 mL/min   Anion gap 4 (L) 5 - 15    MAU Course  Procedures  MDM FHT 163 by doppler Tylenol & Reglan PO given in MAU Nausea resolved; no emesis while in MAU Back pain decreased from 8 to 2/10 Assessment and Plan  Assessment: 1. Nausea and vomiting in pregnancy prior to [redacted] weeks gestation   2. Back pain affecting pregnancy in first trimester     Plan: Discharge home Keep scheduled appt with [facility] on Friday to start prenatal care Rx for Reglan given Urine culture pending  Claudie Reveringrin B Lawrence, Student-NP  07/30/2014, 2:03 AM   I have seen and evaluated the patient with the NP/PA/Med student. I agree with the assessment and plan as written above.   Marny LowensteinJulie N Wenzel, PA-C  07/30/2014 3:34 AM

## 2014-07-30 NOTE — Progress Notes (Signed)
Pt feels dizzy and tired and she has too much headache

## 2014-07-30 NOTE — Discharge Instructions (Signed)
Morning Sickness Morning sickness is when you feel sick to your stomach (nauseous) during pregnancy. You may feel sick to your stomach and throw up (vomit). You may feel sick in the morning, but you can feel this way any time of day. Some women feel very sick to their stomach and cannot stop throwing up (hyperemesis gravidarum). HOME CARE  Only take medicines as told by your doctor.  Take multivitamins as told by your doctor. Taking multivitamins before getting pregnant can stop or lessen the harshness of morning sickness.  Eat dry toast or unsalted crackers before getting out of bed.  Eat 5 to 6 small meals a day.  Eat dry and bland foods like rice and baked potatoes.  Do not drink liquids with meals. Drink between meals.  Do not eat greasy, fatty, or spicy foods.  Have someone cook for you if the smell of food causes you to feel sick or throw up.  If you feel sick to your stomach after taking prenatal vitamins, take them at night or with a snack.  Eat protein when you need a snack (nuts, yogurt, cheese).  Eat unsweetened gelatins for dessert.  Wear a bracelet used for sea sickness (acupressure wristband).  Go to a doctor that puts thin needles into certain body points (acupuncture) to improve how you feel.  Do not smoke.  Use a humidifier to keep the air in your house free of odors.  Get lots of fresh air. GET HELP IF:  You need medicine to feel better.  You feel dizzy or lightheaded.  You are losing weight. GET HELP RIGHT AWAY IF:   You feel very sick to your stomach and cannot stop throwing up.  You pass out (faint). MAKE SURE YOU:  Understand these instructions.  Will watch your condition.  Will get help right away if you are not doing well or get worse. Document Released: 07/21/2004 Document Revised: 06/18/2013 Document Reviewed: 11/28/2012 [facility] Patient Information 2015 [place]. This information is not intended to replace advice given to you by  your health care provider. Make sure you discuss any questions you have with your health care provider. Back Pain in Pregnancy Back pain during pregnancy is common. It happens in about half of all pregnancies. It is important for you and your baby that you remain active during your pregnancy.If you feel that back pain is not allowing you to remain active or sleep well, it is time to see your caregiver. Back pain may be caused by several factors related to changes during your pregnancy.Fortunately, unless you had trouble with your back before your pregnancy, the pain is likely to get better after you deliver. Low back pain usually occurs between the fifth and seventh months of pregnancy. It can, however, happen in the first couple months. Factors that increase the risk of back problems include:   Previous back problems.  Injury to your back.  Having twins or multiple births.  A chronic cough.  Stress.  Job-related repetitive motions.  Muscle or spinal disease in the back.  Family history of back problems, ruptured (herniated) discs, or osteoporosis.  Depression, anxiety, and panic attacks. CAUSES   When you are pregnant, your body produces a hormone called relaxin. This hormonemakes the ligaments connecting the low back and pubic bones more flexible. This flexibility allows the baby to be delivered more easily. When your ligaments are loose, your muscles need to work harder to support your back. Soreness in your back can come from tired muscles.  Soreness can also come from back tissues that are irritated since they are receiving less support.  As the baby grows, it puts pressure on the nerves and blood vessels in your pelvis. This can cause back pain.  As the baby grows and gets heavier during pregnancy, the uterus pushes the stomach muscles forward and changes your center of gravity. This makes your back muscles work harder to maintain good posture. SYMPTOMS  Lumbar pain during  pregnancy Lumbar pain during pregnancy usually occurs at or above the waist in the center of the back. There may be pain and numbness that radiates into your leg or foot. This is similar to low back pain experienced by non-pregnant women. It usually increases with sitting for long periods of time, standing, or repetitive lifting. Tenderness may also be present in the muscles along your upper back. Posterior pelvic pain during pregnancy Pain in the back of the pelvis is more common than lumbar pain in pregnancy. It is a deep pain felt in your side at the waistline, or across the tailbone (sacrum), or in both places. You may have pain on one or both sides. This pain can also go into the buttocks and backs of the upper thighs. Pubic and groin pain may also be present. The pain does not quickly resolve with rest, and morning stiffness may also be present. Pelvic pain during pregnancy can be brought on by most activities. A high level of fitness before and during pregnancy may or may not prevent this problem. Labor pain is usually 1 to 2 minutes apart, lasts for about 1 minute, and involves a bearing down feeling or pressure in your pelvis. However, if you are at term with the pregnancy, constant low back pain can be the beginning of early labor, and you should be aware of this. DIAGNOSIS  X-rays of the back should not be done during the first 12 to 14 weeks of the pregnancy and only when absolutely necessary during the rest of the pregnancy. MRIs do not give off radiation and are safe during pregnancy. MRIs also should only be done when absolutely necessary. HOME CARE INSTRUCTIONS  Exercise as directed by your caregiver. Exercise is the most effective way to prevent or manage back pain. If you have a back problem, it is especially important to avoid sports that require sudden body movements. Swimming and walking are great activities.  Do not stand in one place for long periods of time.  Do not wear high  heels.  Sit in chairs with good posture. Use a pillow on your lower back if necessary. Make sure your head rests over your shoulders and is not hanging forward.  Try sleeping on your side, preferably the left side, with a pillow or two between your legs. If you are sore after a night's rest, your bedmay betoo soft.Try placing a board between your mattress and box spring.  Listen to your body when lifting.If you are experiencing pain, ask for help or try bending yourknees more so you can use your leg muscles rather than your back muscles. Squat down when picking up something from the floor. Do not bend over.  Eat a healthy diet. Try to gain weight within your caregiver's recommendations.  Use heat or cold packs 3 to 4 times a day for 15 minutes to help with the pain.  Only take over-the-counter or prescription medicines for pain, discomfort, or fever as directed by your caregiver. Sudden (acute) back pain  Use bed rest for only  the most extreme, acute episodes of back pain. Prolonged bed rest over 48 hours will aggravate your condition.  Ice is very effective for acute conditions.  Put ice in a plastic bag.  Place a towel between your skin and the bag.  Leave the ice on for 10 to 20 minutes every 2 hours, or as needed.  Using heat packs for 30 minutes prior to activities is also helpful. Continued back pain See your caregiver if you have continued problems. Your caregiver can help or refer you for appropriate physical therapy. With conditioning, most back problems can be avoided. Sometimes, a more serious issue may be the cause of back pain. You should be seen right away if new problems seem to be developing. Your caregiver may recommend:  A maternity girdle.  An elastic sling.  A back brace.  A massage therapist or acupuncture. SEEK MEDICAL CARE IF:   You are not able to do most of your daily activities, even when taking the pain medicine you were given.  You need a  referral to a physical therapist or chiropractor.  You want to try acupuncture. SEEK IMMEDIATE MEDICAL CARE IF:  You develop numbness, tingling, weakness, or problems with the use of your arms or legs.  You develop severe back pain that is no longer relieved with medicines.  You have a sudden change in bowel or bladder control.  You have increasing pain in other areas of the body.  You develop shortness of breath, dizziness, or fainting.  You develop nausea, vomiting, or sweating.  You have back pain which is similar to labor pains.  You have back pain along with your water breaking or vaginal bleeding.  You have back pain or numbness that travels down your leg.  Your back pain developed after you fell.  You develop pain on one side of your back. You may have a kidney stone.  You see blood in your urine. You may have a bladder infection or kidney stone.  You have back pain with blisters. You may have shingles. Back pain is fairly common during pregnancy but should not be accepted as just part of the process. Back pain should always be treated as soon as possible. This will make your pregnancy as pleasant as possible. Document Released: 09/21/2005 Document Revised: 09/05/2011 Document Reviewed: 11/02/2010 [facility] Patient Information 2015 [place], [place]. This information is not intended to replace advice given to you by your health care provider. Make sure you discuss any questions you have with your health care provider.

## 2014-07-30 NOTE — MAU Note (Signed)
Pt's husband states pt is too tired and too sick she has nausea and vomiting. Pt states when she is taking medication for nausea and vomting and it has been making her stomach hurt after she takes it.

## 2014-07-30 NOTE — Progress Notes (Signed)
Pt feels hungry.

## 2014-07-31 LAB — CULTURE, OB URINE
Colony Count: NO GROWTH
Culture: NO GROWTH

## 2014-09-01 ENCOUNTER — Other Ambulatory Visit (HOSPITAL_COMMUNITY): Payer: Self-pay | Admitting: Nurse Practitioner

## 2014-09-01 DIAGNOSIS — Z3689 Encounter for other specified antenatal screening: Secondary | ICD-10-CM

## 2014-09-01 LAB — OB RESULTS CONSOLE HIV ANTIBODY (ROUTINE TESTING): HIV: NONREACTIVE

## 2014-09-01 LAB — OB RESULTS CONSOLE RUBELLA ANTIBODY, IGM: RUBELLA: IMMUNE

## 2014-09-01 LAB — OB RESULTS CONSOLE ABO/RH: RH Type: POSITIVE

## 2014-09-01 LAB — OB RESULTS CONSOLE GC/CHLAMYDIA
CHLAMYDIA, DNA PROBE: NEGATIVE
Gonorrhea: NEGATIVE

## 2014-09-01 LAB — OB RESULTS CONSOLE RPR: RPR: NONREACTIVE

## 2014-09-01 LAB — OB RESULTS CONSOLE ANTIBODY SCREEN: Antibody Screen: NEGATIVE

## 2014-09-01 LAB — OB RESULTS CONSOLE HEPATITIS B SURFACE ANTIGEN: Hepatitis B Surface Ag: NEGATIVE

## 2014-09-10 ENCOUNTER — Ambulatory Visit (HOSPITAL_COMMUNITY)
Admission: RE | Admit: 2014-09-10 | Discharge: 2014-09-10 | Disposition: A | Discharge status: Home / Self Care | Payer: Medicaid Other | Source: Ambulatory Visit | Attending: Nurse Practitioner | Admitting: Nurse Practitioner

## 2014-09-10 DIAGNOSIS — Z36 Encounter for antenatal screening of mother: Secondary | ICD-10-CM | POA: Insufficient documentation

## 2014-09-10 DIAGNOSIS — Z3689 Encounter for other specified antenatal screening: Secondary | ICD-10-CM | POA: Insufficient documentation

## 2014-09-10 DIAGNOSIS — Z3A18 18 weeks gestation of pregnancy: Secondary | ICD-10-CM | POA: Insufficient documentation

## 2014-10-24 ENCOUNTER — Other Ambulatory Visit (HOSPITAL_COMMUNITY): Payer: Self-pay | Admitting: Physician Assistant

## 2014-10-24 DIAGNOSIS — IMO0002 Reserved for concepts with insufficient information to code with codable children: Secondary | ICD-10-CM

## 2014-10-24 DIAGNOSIS — Z0489 Encounter for examination and observation for other specified reasons: Secondary | ICD-10-CM

## 2014-10-31 ENCOUNTER — Ambulatory Visit (HOSPITAL_COMMUNITY)
Admission: RE | Admit: 2014-10-31 | Discharge: 2014-10-31 | Disposition: A | Discharge status: Home / Self Care | Payer: Medicaid Other | Source: Ambulatory Visit | Attending: Physician Assistant | Admitting: Physician Assistant

## 2014-10-31 DIAGNOSIS — Z0489 Encounter for examination and observation for other specified reasons: Secondary | ICD-10-CM

## 2014-10-31 DIAGNOSIS — Z3A26 26 weeks gestation of pregnancy: Secondary | ICD-10-CM | POA: Diagnosis not present

## 2014-10-31 DIAGNOSIS — Z36 Encounter for antenatal screening of mother: Secondary | ICD-10-CM | POA: Insufficient documentation

## 2014-10-31 DIAGNOSIS — IMO0002 Reserved for concepts with insufficient information to code with codable children: Secondary | ICD-10-CM

## 2015-01-16 LAB — OB RESULTS CONSOLE GC/CHLAMYDIA
Chlamydia: NEGATIVE
Gonorrhea: NEGATIVE

## 2015-01-16 LAB — OB RESULTS CONSOLE GBS: GBS: NEGATIVE

## 2015-01-17 ENCOUNTER — Inpatient Hospital Stay (HOSPITAL_COMMUNITY)
Admission: AD | Admit: 2015-01-17 | Discharge: 2015-01-17 | Disposition: A | Discharge status: Home / Self Care | Payer: Medicaid Other | Source: Ambulatory Visit | Attending: Obstetrics & Gynecology | Admitting: Obstetrics & Gynecology

## 2015-01-17 ENCOUNTER — Encounter (HOSPITAL_COMMUNITY): Payer: Self-pay

## 2015-01-17 DIAGNOSIS — Z3493 Encounter for supervision of normal pregnancy, unspecified, third trimester: Secondary | ICD-10-CM | POA: Diagnosis present

## 2015-01-17 NOTE — MAU Note (Signed)
Pt presents complaining of contractions and lower abdominal pain. Also has some numbness in her arms and legs. Denies vaginal bleeding or discharge. Reports good fetal movement.

## 2015-01-17 NOTE — Discharge Instructions (Signed)
Third Trimester of Pregnancy °The third trimester is from week 29 through week 42, months 7 through 9. The third trimester is a time when the fetus is growing rapidly. At the end of the ninth month, the fetus is about 20 inches in length and weighs 6-10 pounds.  °BODY CHANGES °Your body goes through many changes during pregnancy. The changes vary from woman to woman.  °· Your weight will continue to increase. You can expect to gain 25-35 pounds (11-16 kg) by the end of the pregnancy. °· You may begin to get stretch marks on your hips, abdomen, and breasts. °· You may urinate more often because the fetus is moving lower into your pelvis and pressing on your bladder. °· You may develop or continue to have heartburn as a result of your pregnancy. °· You may develop constipation because certain hormones are causing the muscles that push waste through your intestines to slow down. °· You may develop hemorrhoids or swollen, bulging veins (varicose veins). °· You may have pelvic pain because of the weight gain and pregnancy hormones relaxing your joints between the bones in your pelvis. Backaches may result from overexertion of the muscles supporting your posture. °· You may have changes in your hair. These can include thickening of your hair, rapid growth, and changes in texture. Some women also have hair loss during or after pregnancy, or hair that feels dry or thin. Your hair will most likely return to normal after your baby is born. °· Your breasts will continue to grow and be tender. A yellow discharge may leak from your breasts called colostrum. °· Your belly button may stick out. °· You may feel short of breath because of your expanding uterus. °· You may notice the fetus "dropping," or moving lower in your abdomen. °· You may have a bloody mucus discharge. This usually occurs a few days to a week before labor begins. °· Your cervix becomes thin and soft (effaced) near your due date. °WHAT TO EXPECT AT YOUR PRENATAL  EXAMS  °You will have prenatal exams every 2 weeks until week 36. Then, you will have weekly prenatal exams. During a routine prenatal visit: °· You will be weighed to make sure you and the fetus are growing normally. °· Your blood pressure is taken. °· Your abdomen will be measured to track your baby's growth. °· The fetal heartbeat will be listened to. °· Any test results from the previous visit will be discussed. °· You may have a cervical check near your due date to see if you have effaced. °At around 36 weeks, your caregiver will check your cervix. At the same time, your caregiver will also perform a test on the secretions of the vaginal tissue. This test is to determine if a type of bacteria, Group B streptococcus, is present. Your caregiver will explain this further. °Your caregiver may ask you: °· What your birth plan is. °· How you are feeling. °· If you are feeling the baby move. °· If you have had any abnormal symptoms, such as leaking fluid, bleeding, severe headaches, or abdominal cramping. °· If you have any questions. °Other tests or screenings that may be performed during your third trimester include: °· Blood tests that check for low iron levels (anemia). °· Fetal testing to check the health, activity level, and growth of the fetus. Testing is done if you have certain medical conditions or if there are problems during the pregnancy. °FALSE LABOR °You may feel small, irregular contractions that   eventually go away. These are called Braxton Hicks contractions, or false labor. Contractions may last for hours, days, or even weeks before true labor sets in. If contractions come at regular intervals, intensify, or become painful, it is best to be seen by your caregiver.  °SIGNS OF LABOR  °· Menstrual-like cramps. °· Contractions that are 5 minutes apart or less. °· Contractions that start on the top of the uterus and spread down to the lower abdomen and back. °· A sense of increased pelvic pressure or back  pain. °· A watery or bloody mucus discharge that comes from the vagina. °If you have any of these signs before the 37th week of pregnancy, call your caregiver right away. You need to go to the hospital to get checked immediately. °HOME CARE INSTRUCTIONS  °· Avoid all smoking, herbs, alcohol, and unprescribed drugs. These chemicals affect the formation and growth of the baby. °· Follow your caregiver's instructions regarding medicine use. There are medicines that are either safe or unsafe to take during pregnancy. °· Exercise only as directed by your caregiver. Experiencing uterine cramps is a good sign to stop exercising. °· Continue to eat regular, healthy meals. °· Wear a good support bra for breast tenderness. °· Do not use hot tubs, steam rooms, or saunas. °· Wear your seat belt at all times when driving. °· Avoid raw meat, uncooked cheese, cat litter boxes, and soil used by cats. These carry germs that can cause birth defects in the baby. °· Take your prenatal vitamins. °· Try taking a stool softener (if your caregiver approves) if you develop constipation. Eat more high-fiber foods, such as fresh vegetables or fruit and whole grains. Drink plenty of fluids to keep your urine clear or pale yellow. °· Take warm sitz baths to soothe any pain or discomfort caused by hemorrhoids. Use hemorrhoid cream if your caregiver approves. °· If you develop varicose veins, wear support hose. Elevate your feet for 15 minutes, 3-4 times a day. Limit salt in your diet. °· Avoid heavy lifting, wear low heal shoes, and practice good posture. °· Rest a lot with your legs elevated if you have leg cramps or low back pain. °· Visit your dentist if you have not gone during your pregnancy. Use a soft toothbrush to brush your teeth and be gentle when you floss. °· A sexual relationship may be continued unless your caregiver directs you otherwise. °· Do not travel far distances unless it is absolutely necessary and only with the approval  of your caregiver. °· Take prenatal classes to understand, practice, and ask questions about the labor and delivery. °· Make a trial run to the hospital. °· Pack your hospital bag. °· Prepare the baby's nursery. °· Continue to go to all your prenatal visits as directed by your caregiver. °SEEK MEDICAL CARE IF: °· You are unsure if you are in labor or if your water has broken. °· You have dizziness. °· You have mild pelvic cramps, pelvic pressure, or nagging pain in your abdominal area. °· You have persistent nausea, vomiting, or diarrhea. °· You have a bad smelling vaginal discharge. °· You have pain with urination. °SEEK IMMEDIATE MEDICAL CARE IF:  °· You have a fever. °· You are leaking fluid from your vagina. °· You have spotting or bleeding from your vagina. °· You have severe abdominal cramping or pain. °· You have rapid weight loss or gain. °· You have shortness of breath with chest pain. °· You notice sudden or extreme swelling   of your face, hands, ankles, feet, or legs. °· You have not felt your baby move in over an hour. °· You have severe headaches that do not go away with medicine. °· You have vision changes. °Document Released: 06/07/2001 Document Revised: 06/18/2013 Document Reviewed: 08/14/2012 °[employer]® Patient Information ©2015 [employer]. This information is not intended to replace advice given to you by your health care provider. Make sure you discuss any questions you have with your health care provider. °Fetal Movement Counts °Patient Name: __________________________________________________ Patient Due Date: ____________________ °Performing a fetal movement count is highly recommended in high-risk pregnancies, but it is good for every pregnant woman to do. Your health care provider may ask you to start counting fetal movements at 28 weeks of the pregnancy. Fetal movements often increase: °· After eating a full meal. °· After physical activity. °· After eating or drinking something sweet or  cold. °· At rest. °Pay attention to when you feel the baby is most active. This will help you notice a pattern of your baby's sleep and wake cycles and what factors contribute to an increase in fetal movement. It is important to perform a fetal movement count at the same time each day when your baby is normally most active.  °HOW TO COUNT FETAL MOVEMENTS °· Find a quiet and comfortable area to sit or lie down on your left side. Lying on your left side provides the best blood and oxygen circulation to your baby. °· Write down the day and time on a sheet of paper or in a journal. °· Start counting kicks, flutters, swishes, rolls, or jabs in a 2-hour period. You should feel at least 10 movements within 2 hours. °· If you do not feel 10 movements in 2 hours, wait 2-3 hours and count again. Look for a change in the pattern or not enough counts in 2 hours. °SEEK MEDICAL CARE IF: °· You feel less than 10 counts in 2 hours, tried twice. °· There is no movement in over an hour. °· The pattern is changing or taking longer each day to reach 10 counts in 2 hours. °· You feel the baby is not moving as he or she usually does. °Date: ____________ Movements: ____________ Start time: ____________ Finish time: ____________  °Date: ____________ Movements: ____________ Start time: ____________ Finish time: ____________ °Date: ____________ Movements: ____________ Start time: ____________ Finish time: ____________ °Date: ____________ Movements: ____________ Start time: ____________ Finish time: ____________ °Date: ____________ Movements: ____________ Start time: ____________ Finish time: ____________ °Date: ____________ Movements: ____________ Start time: ____________ Finish time: ____________ °Date: ____________ Movements: ____________ Start time: ____________ Finish time: ____________ °Date: ____________ Movements: ____________ Start time: ____________ Finish time: ____________  °Date: ____________ Movements: ____________ Start time:  ____________ Finish time: ____________ °Date: ____________ Movements: ____________ Start time: ____________ Finish time: ____________ °Date: ____________ Movements: ____________ Start time: ____________ Finish time: ____________ °Date: ____________ Movements: ____________ Start time: ____________ Finish time: ____________ °Date: ____________ Movements: ____________ Start time: ____________ Finish time: ____________ °Date: ____________ Movements: ____________ Start time: ____________ Finish time: ____________ °Date: ____________ Movements: ____________ Start time: ____________ Finish time: ____________  °Date: ____________ Movements: ____________ Start time: ____________ Finish time: ____________ °Date: ____________ Movements: ____________ Start time: ____________ Finish time: ____________ °Date: ____________ Movements: ____________ Start time: ____________ Finish time: ____________ °Date: ____________ Movements: ____________ Start time: ____________ Finish time: ____________ °Date: ____________ Movements: ____________ Start time: ____________ Finish time: ____________ °Date: ____________ Movements: ____________ Start time: ____________ Finish time: ____________ °Date: ____________ Movements: ____________ Start time: ____________ Finish time:   ____________  °Date: ____________ Movements: ____________ Start time: ____________ Finish time: ____________ °Date: ____________ Movements: ____________ Start time: ____________ Finish time: ____________ °Date: ____________ Movements: ____________ Start time: ____________ Finish time: ____________ °Date: ____________ Movements: ____________ Start time: ____________ Finish time: ____________ °Date: ____________ Movements: ____________ Start time: ____________ Finish time: ____________ °Date: ____________ Movements: ____________ Start time: ____________ Finish time: ____________ °Date: ____________ Movements: ____________ Start time: ____________ Finish time: ____________  °Date:  ____________ Movements: ____________ Start time: ____________ Finish time: ____________ °Date: ____________ Movements: ____________ Start time: ____________ Finish time: ____________ °Date: ____________ Movements: ____________ Start time: ____________ Finish time: ____________ °Date: ____________ Movements: ____________ Start time: ____________ Finish time: ____________ °Date: ____________ Movements: ____________ Start time: ____________ Finish time: ____________ °Date: ____________ Movements: ____________ Start time: ____________ Finish time: ____________ °Date: ____________ Movements: ____________ Start time: ____________ Finish time: ____________  °Date: ____________ Movements: ____________ Start time: ____________ Finish time: ____________ °Date: ____________ Movements: ____________ Start time: ____________ Finish time: ____________ °Date: ____________ Movements: ____________ Start time: ____________ Finish time: ____________ °Date: ____________ Movements: ____________ Start time: ____________ Finish time: ____________ °Date: ____________ Movements: ____________ Start time: ____________ Finish time: ____________ °Date: ____________ Movements: ____________ Start time: ____________ Finish time: ____________ °Date: ____________ Movements: ____________ Start time: ____________ Finish time: ____________  °Date: ____________ Movements: ____________ Start time: ____________ Finish time: ____________ °Date: ____________ Movements: ____________ Start time: ____________ Finish time: ____________ °Date: ____________ Movements: ____________ Start time: ____________ Finish time: ____________ °Date: ____________ Movements: ____________ Start time: ____________ Finish time: ____________ °Date: ____________ Movements: ____________ Start time: ____________ Finish time: ____________ °Date: ____________ Movements: ____________ Start time: ____________ Finish time: ____________ °Date: ____________ Movements: ____________ Start  time: ____________ Finish time: ____________  °Date: ____________ Movements: ____________ Start time: ____________ Finish time: ____________ °Date: ____________ Movements: ____________ Start time: ____________ Finish time: ____________ °Date: ____________ Movements: ____________ Start time: ____________ Finish time: ____________ °Date: ____________ Movements: ____________ Start time: ____________ Finish time: ____________ °Date: ____________ Movements: ____________ Start time: ____________ Finish time: ____________ °Date: ____________ Movements: ____________ Start time: ____________ Finish time: ____________ °Document Released: 07/13/2006 Document Revised: 10/28/2013 Document Reviewed: 04/09/2012 °[employer]® Patient Information ©2015 [employer]. This information is not intended to replace advice given to you by your health care provider. Make sure you discuss any questions you have with your health care provider. °Braxton Hicks Contractions °Contractions of the uterus can occur throughout pregnancy. Contractions are not always a sign that you are in labor.  °WHAT ARE BRAXTON HICKS CONTRACTIONS?  °Contractions that occur before labor are called Braxton Hicks contractions, or false labor. Toward the end of pregnancy (32-34 weeks), these contractions can develop more often and may become more forceful. This is not true labor because these contractions do not result in opening (dilatation) and thinning of the cervix. They are sometimes difficult to tell apart from true labor because these contractions can be forceful and people have different pain tolerances. You should not feel embarrassed if you go to the hospital with false labor. Sometimes, the only way to tell if you are in true labor is for your health care provider to look for changes in the cervix. °If there are no prenatal problems or other health problems associated with the pregnancy, it is completely safe to be sent home with false labor and await the  onset of true labor. °HOW CAN YOU TELL THE DIFFERENCE BETWEEN TRUE AND FALSE LABOR? °False Labor °· The contractions of false labor are usually shorter and not as hard as those of true labor.   °· The contractions   are usually irregular.   °· The contractions are often felt in the front of the lower abdomen and in the groin.   °· The contractions may go away when you walk around or change positions while lying down.   °· The contractions get weaker and are shorter lasting as time goes on.   °· The contractions do not usually become progressively stronger, regular, and closer together as with true labor.   °True Labor °· Contractions in true labor last 30-70 seconds, become very regular, usually become more intense, and increase in frequency.   °· The contractions do not go away with walking.   °· The discomfort is usually felt in the top of the uterus and spreads to the lower abdomen and low back.   °· True labor can be determined by your health care provider with an exam. This will show that the cervix is dilating and getting thinner.   °WHAT TO REMEMBER °· Keep up with your usual exercises and follow other instructions given by your health care provider.   °· Take medicines as directed by your health care provider.   °· Keep your regular prenatal appointments.   °· Eat and drink lightly if you think you are going into labor.   °· If Braxton Hicks contractions are making you uncomfortable:   °· Change your position from lying down or resting to walking, or from walking to resting.   °· Sit and rest in a tub of warm water.   °· Drink 2-3 glasses of water. Dehydration may cause these contractions.   °· Do slow and deep breathing several times an hour.   °WHEN SHOULD I SEEK IMMEDIATE MEDICAL CARE? °Seek immediate medical care if: °· Your contractions become stronger, more regular, and closer together.   °· You have fluid leaking or gushing from your vagina.   °· You have a fever.   °· You pass blood-tinged mucus.    °· You have vaginal bleeding.   °· You have continuous abdominal pain.   °· You have low back pain that you never had before.   °· You feel your baby's head pushing down and causing pelvic pressure.   °· Your baby is not moving as much as it used to.   °Document Released: 06/13/2005 Document Revised: 06/18/2013 Document Reviewed: 03/25/2013 °[employer]® Patient Information ©2015 [employer]. This information is not intended to replace advice given to you by your health care provider. Make sure you discuss any questions you have with your health care provider. ° °

## 2015-01-24 ENCOUNTER — Inpatient Hospital Stay (HOSPITAL_COMMUNITY)
Admission: AD | Admit: 2015-01-24 | Discharge: 2015-01-24 | Disposition: A | Discharge status: Home / Self Care | Payer: Medicaid Other | Source: Ambulatory Visit | Attending: Family Medicine | Admitting: Family Medicine

## 2015-01-24 DIAGNOSIS — O99613 Diseases of the digestive system complicating pregnancy, third trimester: Secondary | ICD-10-CM | POA: Insufficient documentation

## 2015-01-24 DIAGNOSIS — R1013 Epigastric pain: Secondary | ICD-10-CM | POA: Diagnosis not present

## 2015-01-24 DIAGNOSIS — O9989 Other specified diseases and conditions complicating pregnancy, childbirth and the puerperium: Secondary | ICD-10-CM | POA: Diagnosis not present

## 2015-01-24 DIAGNOSIS — Z3A38 38 weeks gestation of pregnancy: Secondary | ICD-10-CM | POA: Diagnosis not present

## 2015-01-24 DIAGNOSIS — O26899 Other specified pregnancy related conditions, unspecified trimester: Secondary | ICD-10-CM

## 2015-01-24 DIAGNOSIS — K219 Gastro-esophageal reflux disease without esophagitis: Secondary | ICD-10-CM | POA: Diagnosis not present

## 2015-01-24 MED ORDER — GI COCKTAIL ~~LOC~~
30.0000 mL | Freq: Once | ORAL | Status: AC
  Administered 2015-01-24: 30 mL via ORAL
  Filled 2015-01-24: qty 30

## 2015-01-24 NOTE — Discharge Instructions (Signed)
Medications to use for heartburn: Zantac once a day and Tums after meals as needed   Heartburn During Pregnancy  Heartburn is a burning sensation in the chest caused by stomach acid backing up into the esophagus. Heartburn is common in pregnancy because a certain hormone (progesterone) is released when a woman is pregnant. The progesterone hormone may relax the valve that separates the esophagus from the stomach. This allows acid to go up into the esophagus, causing heartburn. Heartburn may also happen in pregnancy because the enlarging uterus pushes up on the stomach, which pushes more acid into the esophagus. This is especially true in the later stages of pregnancy. Heartburn problems usually go away after giving birth. CAUSES  Heartburn is caused by stomach acid backing up into the esophagus. During pregnancy, this may result from various things, including:   The progesterone hormone.  Changing hormone levels.  The growing uterus pushing stomach acid upward.  Large meals.  Certain foods and drinks.  Exercise.  Increased acid production. SIGNS AND SYMPTOMS   Burning pain in the chest or lower throat.  Bitter taste in the mouth.  Coughing. DIAGNOSIS  Your health care provider will typically diagnose heartburn by taking a careful history of your concern. Blood tests may be done to check for a certain type of bacteria that is associated with heartburn. Sometimes, heartburn is diagnosed by prescribing a heartburn medicine to see if the symptoms improve. In some cases, a procedure called an endoscopy may be done. In this procedure, a tube with a light and a camera on the end (endoscope) is used to examine the esophagus and the stomach. TREATMENT  Treatment will vary depending on the severity of your symptoms. Your health care provider may recommend:  Over-the-counter medicines (antacids, acid reducers) for mild heartburn.  Prescription medicines to decrease stomach acid or to protect  your stomach lining.  Certain changes in your diet.  Elevating the head of your bed by putting blocks under the legs. This helps prevent stomach acid from backing up into the esophagus when you are lying down. HOME CARE INSTRUCTIONS   Only take over-the-counter or prescription medicines as directed by your health care provider.  Raise the head of your bed by putting blocks under the legs if instructed to do so by your health care provider. Sleeping with more pillows is not effective because it only changes the position of your head.  Do not exercise right after eating.  Avoid eating 2-3 hours before bed. Do not lie down right after eating.  Eat small meals throughout the day instead of three large meals.  Identify foods and beverages that make your symptoms worse and avoid them. Foods you may want to avoid include:  Peppers.  Chocolate.  High-fat foods, including fried foods.  Spicy foods.  Garlic and onions.  Citrus fruits, including oranges, grapefruit, lemons, and limes.  Food containing tomatoes or tomato products.  Mint.  Carbonated and caffeinated drinks.  Vinegar. SEEK MEDICAL CARE IF:  You have abdominal pain of any kind.  You feel burning in your upper abdomen or chest, especially after eating or lying down.  You have nausea and vomiting.  Your stomach feels upset after you eat. SEEK IMMEDIATE MEDICAL CARE IF:   You have severe chest pain that goes down your arm or into your jaw or neck.  You feel sweaty, dizzy, or light-headed.  You become short of breath.  You vomit blood.  You have difficulty or pain with swallowing.  You  have bloody or black, tarry stools.  You have episodes of heartburn more than 3 times a week, for more than 2 weeks. MAKE SURE YOU:  Understand these instructions.  Will watch your condition.  Will get help right away if you are not doing well or get worse. Document Released: 06/10/2000 Document Revised: 06/18/2013  Document Reviewed: 01/30/2013 [facility] Patient Information 2015 [place], [place]. This information is not intended to replace advice given to you by your health care provider. Make sure you discuss any questions you have with your health care provider.

## 2015-01-24 NOTE — Progress Notes (Signed)
Patient sleeping soundly. Left undisturbed.

## 2015-01-24 NOTE — MAU Note (Signed)
States began to have upper abdominal pain yesterday and has continued to get worse. Denies N/V. States pain not related to eating.

## 2015-01-24 NOTE — Progress Notes (Signed)
Urine in lab 

## 2015-01-25 NOTE — MAU Provider Note (Signed)
  History  Chief Complaint:  Abdominal Pain  Kayla Bender is a 24 y.o. G93P1001 female at [redacted]w[redacted]d presenting w/ report of epigastric pain x 2 days w/ occ pain in Lt upper back. No n/v/d, no relation to food, denies ha, scotomata. Hasn't tried tums/antacids.  Reports active fetal movement, contractions: none, vaginal bleeding: none, membranes: intact. Denies uti s/s, abnormal/malodorous vag d/c or vulvovaginal itching/irritation.   Prenatal care at HD.  Pregnancy complicated by language barrier- Nepali, h/o LBW infant, late to care @ 19wks.   Obstetrical History: OB History    Gravida Para Term Preterm AB TAB SAB Ectopic Multiple Living   0 0 0 0 0 0 1      Past Medical History: Past Medical History  Diagnosis Date  . GERD (gastroesophageal reflux disease)     Past Surgical History: Past Surgical History  Procedure Laterality Date  . Cesarean section    . Esophagogastroduodenoscopy N/A 12/05/2012    Procedure: ESOPHAGOGASTRODUODENOSCOPY (EGD);  Surgeon: Petra Kuba, MD;  Location: [facility] ENDOSCOPY;  Service: Endoscopy;  Laterality: N/A;    Social History: History   Social History  . Marital Status: Married    Spouse Name: N/A  . Number of Children: N/A  . Years of Education: N/A   Social History Main Topics  . Smoking status: Never Smoker   . Smokeless tobacco: Not on file  . Alcohol Use: No  . Drug Use: No  . Sexual Activity: Yes    Birth Control/ Protection: None   Other Topics Concern  . Not on file   Social History Narrative    Allergies: Allergies  Allergen Reactions  . Promethazine Other (See Comments)    Extrapyramidal symptoms    No prescriptions prior to admission    Review of Systems  Pertinent pos/neg as indicated in HPI  Physical Exam  Blood pressure 94/60, pulse 64, temperature 97.3 F (36.3 C), temperature source Oral, resp. rate 18, last menstrual period 05/01/2014. General appearance: alert, cooperative and no distress Lungs: clear to  auscultation bilaterally, normal effort Heart: regular rate and rhythm Abdomen: gravid, soft, non-tender Extremities: none edema DTR's 2+  Spec exam: n/a Cultures/Specimens: n/a    Fetal monitoring: FHR: 130 bpm, variability: moderate,  Accelerations: Present,  decelerations:  Absent Uterine activity: none  MAU Course  Exam, UA, GI cocktail- which relieved all pain  Labs:  No results found for this or any previous visit (from the past 24 hour(s)).  Imaging:  n/a  Assessment and Plan  A:  [redacted]w[redacted]d SIUP  G2P1001  Epigastric pain r/t reflux, pain relieved by GI cocktail  Cat 1 FHR P:  D/C home  Reviewed labor s/s, fkc  Instructed to take TUMS or otc antacid as needed  Keep next appt at HD as scheduled   Marge Duncans CNM,WHNP-BC 7/31/20161:52 AM

## 2015-02-13 ENCOUNTER — Other Ambulatory Visit (HOSPITAL_COMMUNITY): Payer: Self-pay | Admitting: Nurse Practitioner

## 2015-02-13 DIAGNOSIS — O480 Post-term pregnancy: Secondary | ICD-10-CM

## 2015-02-16 ENCOUNTER — Ambulatory Visit (HOSPITAL_COMMUNITY)
Admission: RE | Admit: 2015-02-16 | Discharge: 2015-02-16 | Disposition: A | Discharge status: Home / Self Care | Payer: Medicaid Other | Source: Ambulatory Visit | Attending: Nurse Practitioner | Admitting: Nurse Practitioner

## 2015-02-16 DIAGNOSIS — O480 Post-term pregnancy: Secondary | ICD-10-CM | POA: Insufficient documentation

## 2015-02-17 ENCOUNTER — Telehealth (HOSPITAL_COMMUNITY): Payer: Self-pay | Admitting: *Deleted

## 2015-02-17 ENCOUNTER — Encounter (HOSPITAL_COMMUNITY): Payer: Self-pay | Admitting: *Deleted

## 2015-02-17 NOTE — Telephone Encounter (Signed)
Preadmission screen Interpreter number [phone]

## 2015-02-20 ENCOUNTER — Inpatient Hospital Stay (HOSPITAL_COMMUNITY)
Admission: AD | Admit: 2015-02-20 | Discharge: 2015-02-22 | DRG: 775 | Disposition: A | Discharge status: Home / Self Care | Payer: Medicaid Other | Source: Ambulatory Visit | Attending: Obstetrics and Gynecology | Admitting: Obstetrics and Gynecology

## 2015-02-20 ENCOUNTER — Encounter (HOSPITAL_COMMUNITY): Payer: Self-pay | Admitting: *Deleted

## 2015-02-20 DIAGNOSIS — Z3490 Encounter for supervision of normal pregnancy, unspecified, unspecified trimester: Secondary | ICD-10-CM

## 2015-02-20 DIAGNOSIS — O480 Post-term pregnancy: Principal | ICD-10-CM | POA: Diagnosis present

## 2015-02-20 DIAGNOSIS — Z3A41 41 weeks gestation of pregnancy: Secondary | ICD-10-CM | POA: Diagnosis present

## 2015-02-20 DIAGNOSIS — K219 Gastro-esophageal reflux disease without esophagitis: Secondary | ICD-10-CM | POA: Diagnosis present

## 2015-02-20 DIAGNOSIS — O9962 Diseases of the digestive system complicating childbirth: Secondary | ICD-10-CM | POA: Diagnosis present

## 2015-02-20 LAB — CBC
HCT: 36.9 % (ref 36.0–46.0)
HEMOGLOBIN: 12.8 g/dL (ref 12.0–15.0)
MCH: 32.6 pg (ref 26.0–34.0)
MCHC: 34.7 g/dL (ref 30.0–36.0)
MCV: 93.9 fL (ref 78.0–100.0)
Platelets: 263 10*3/uL (ref 150–400)
RBC: 3.93 MIL/uL (ref 3.87–5.11)
RDW: 12.6 % (ref 11.5–15.5)
WBC: 11.2 10*3/uL — AB (ref 4.0–10.5)

## 2015-02-20 LAB — TYPE AND SCREEN
ABO/RH(D): A POS
ANTIBODY SCREEN: NEGATIVE

## 2015-02-20 LAB — ABO/RH: ABO/RH(D): A POS

## 2015-02-20 MED ORDER — DIBUCAINE 1 % RE OINT: 1.0000 "application " | TOPICAL_OINTMENT | RECTAL | Status: DC | PRN

## 2015-02-20 MED ORDER — DOCUSATE SODIUM 100 MG PO CAPS
100.0000 mg | ORAL_CAPSULE | Freq: Two times a day (BID) | ORAL | Status: DC
Start: 2015-02-20 — End: 2015-02-22
  Administered 2015-02-21 – 2015-02-22 (×2): 100 mg via ORAL
  Filled 2015-02-20 (×3): qty 1

## 2015-02-20 MED ORDER — PRENATAL MULTIVITAMIN CH
1.0000 | ORAL_TABLET | Freq: Every day | ORAL | Status: DC
  Administered 2015-02-22: 1 via ORAL
  Filled 2015-02-20 (×2): qty 1

## 2015-02-20 MED ORDER — FENTANYL CITRATE (PF) 100 MCG/2ML IJ SOLN
INTRAMUSCULAR | Status: AC
  Administered 2015-02-20: 100 ug via INTRAVENOUS
  Filled 2015-02-20: qty 2

## 2015-02-20 MED ORDER — LACTATED RINGERS IV SOLN: 500.0000 mL | INTRAVENOUS | Status: DC | PRN

## 2015-02-20 MED ORDER — LIDOCAINE HCL (PF) 1 % IJ SOLN
30.0000 mL | INTRAMUSCULAR | Status: DC | PRN
  Filled 2015-02-20: qty 30

## 2015-02-20 MED ORDER — BENZOCAINE-MENTHOL 20-0.5 % EX AERO
1.0000 | INHALATION_SPRAY | CUTANEOUS | Status: DC | PRN
Start: 2015-02-20 — End: 2015-02-22

## 2015-02-20 MED ORDER — OXYCODONE-ACETAMINOPHEN 5-325 MG PO TABS
2.0000 | ORAL_TABLET | ORAL | Status: DC | PRN
  Filled 2015-02-20: qty 2

## 2015-02-20 MED ORDER — OXYTOCIN BOLUS FROM INFUSION: 500.0000 mL | INTRAVENOUS | Status: DC

## 2015-02-20 MED ORDER — MISOPROSTOL 50MCG HALF TABLET
50.0000 ug | ORAL_TABLET | Freq: Once | ORAL | Status: AC
  Administered 2015-02-20: 50 ug via ORAL
  Filled 2015-02-20: qty 0.5

## 2015-02-20 MED ORDER — OXYCODONE-ACETAMINOPHEN 5-325 MG PO TABS
1.0000 | ORAL_TABLET | ORAL | Status: DC | PRN
  Administered 2015-02-21: 1 via ORAL
  Filled 2015-02-20: qty 1

## 2015-02-20 MED ORDER — CITRIC ACID-SODIUM CITRATE 334-500 MG/5ML PO SOLN: 30.0000 mL | ORAL | Status: DC | PRN

## 2015-02-20 MED ORDER — ONDANSETRON HCL 4 MG PO TABS
4.0000 mg | ORAL_TABLET | ORAL | Status: DC | PRN
Start: 2015-02-20 — End: 2015-02-22

## 2015-02-20 MED ORDER — OXYCODONE-ACETAMINOPHEN 5-325 MG PO TABS
1.0000 | ORAL_TABLET | ORAL | Status: DC | PRN
  Administered 2015-02-20: 1 via ORAL
  Filled 2015-02-20: qty 1

## 2015-02-20 MED ORDER — OXYTOCIN 40 UNITS IN LACTATED RINGERS INFUSION - SIMPLE MED: 62.5000 mL/h | INTRAVENOUS | Status: DC | PRN

## 2015-02-20 MED ORDER — LACTATED RINGERS IV SOLN: INTRAVENOUS | Status: DC

## 2015-02-20 MED ORDER — SIMETHICONE 80 MG PO CHEW: 80.0000 mg | CHEWABLE_TABLET | ORAL | Status: DC | PRN

## 2015-02-20 MED ORDER — WITCH HAZEL-GLYCERIN EX PADS: 1.0000 "application " | MEDICATED_PAD | CUTANEOUS | Status: DC | PRN

## 2015-02-20 MED ORDER — FENTANYL CITRATE (PF) 100 MCG/2ML IJ SOLN
100.0000 ug | Freq: Once | INTRAMUSCULAR | Status: AC
  Administered 2015-02-20: 100 ug via INTRAVENOUS

## 2015-02-20 MED ORDER — ONDANSETRON HCL 4 MG/2ML IJ SOLN: 4.0000 mg | Freq: Four times a day (QID) | INTRAMUSCULAR | Status: DC | PRN

## 2015-02-20 MED ORDER — IBUPROFEN 600 MG PO TABS
600.0000 mg | ORAL_TABLET | Freq: Four times a day (QID) | ORAL | Status: DC
  Administered 2015-02-20 – 2015-02-22 (×5): 600 mg via ORAL
  Filled 2015-02-20 (×7): qty 1

## 2015-02-20 MED ORDER — OXYTOCIN 40 UNITS IN LACTATED RINGERS INFUSION - SIMPLE MED
62.5000 mL/h | INTRAVENOUS | Status: DC
  Administered 2015-02-20: 62.5 mL/h via INTRAVENOUS
  Filled 2015-02-20: qty 1000

## 2015-02-20 MED ORDER — FLEET ENEMA 7-19 GM/118ML RE ENEM: 1.0000 | ENEMA | RECTAL | Status: DC | PRN

## 2015-02-20 MED ORDER — DIPHENHYDRAMINE HCL 25 MG PO CAPS: 25.0000 mg | ORAL_CAPSULE | Freq: Four times a day (QID) | ORAL | Status: DC | PRN

## 2015-02-20 MED ORDER — LANOLIN HYDROUS EX OINT: TOPICAL_OINTMENT | CUTANEOUS | Status: DC | PRN

## 2015-02-20 MED ORDER — ACETAMINOPHEN 325 MG PO TABS: 650.0000 mg | ORAL_TABLET | ORAL | Status: DC | PRN

## 2015-02-20 MED ORDER — ONDANSETRON HCL 4 MG/2ML IJ SOLN: 4.0000 mg | INTRAMUSCULAR | Status: DC | PRN

## 2015-02-20 MED ORDER — MISOPROSTOL 200 MCG PO TABS
50.0000 ug | ORAL_TABLET | ORAL | Status: DC
  Administered 2015-02-20: 50 ug via ORAL
  Filled 2015-02-20: qty 0.5

## 2015-02-20 NOTE — H&P (Signed)
OBSTETRIC ADMISSION HISTORY AND PHYSICAL  Kayla Bender is a 24 y.o. female G2P1001 with IUP at [redacted]w[redacted]d by LMP/18 presenting for IOL due to post term pregnancy.  She reports +FMs, No LOF, no VB, no blurry vision, headaches or peripheral edema, and RUQ pain.  She plans on breast feeding. She request possible IUD for birth control.  Dating: By LMP--->  Estimated Date of Delivery: 02/13/15   Prenatal History/Complications:  Past Medical History: Past Medical History  Diagnosis Date  . GERD (gastroesophageal reflux disease)     Past Surgical History: Past Surgical History  Procedure Laterality Date  . Esophagogastroduodenoscopy N/A 12/05/2012    Procedure: ESOPHAGOGASTRODUODENOSCOPY (EGD);  Surgeon: Petra Kuba, MD;  Location: [facility] ENDOSCOPY;  Service: Endoscopy;  Laterality: N/A;    Obstetrical History: OB History    Gravida Para Term Preterm AB TAB SAB Ectopic Multiple Living   2 1 1  0 0 0 0 0 0 1      Social History: Social History   Social History  . Marital Status: Married    Spouse Name: N/A  . Number of Children: N/A  . Years of Education: N/A   Social History Main Topics  . Smoking status: Never Smoker   . Smokeless tobacco: None  . Alcohol Use: No  . Drug Use: No  . Sexual Activity: Yes    Birth Control/ Protection: None   Other Topics Concern  . None   Social History Narrative    Family History: History reviewed. No pertinent family history.  Allergies: Allergies  Allergen Reactions  . Promethazine Other (See Comments)    Extrapyramidal symptoms    Prescriptions prior to admission  Medication Sig Dispense Refill Last Dose  . acyclovir (ZOVIRAX) 400 MG tablet Take 1 tablet (400 mg total) by mouth 3 (three) times daily. (Patient not taking: Reported on 01/17/2015) 20 tablet 0 Completed Course at Unknown time  . Alum & Mag Hydroxide-Simeth (MAGIC MOUTHWASH) SOLN Take 5 mLs by mouth 3 (three) times daily as needed for mouth pain. (Patient not taking:  Reported on 01/17/2015) 15 mL 0 Not Taking at Unknown time  . Doxylamine-Pyridoxine (DICLEGIS) 10-10 MG TBEC Take 1 tablet by mouth 2 (two) times daily. (Patient not taking: Reported on 01/17/2015) 60 tablet 0 Not Taking at Unknown time  . metoCLOPramide (REGLAN) 10 MG tablet Take 1 tablet (10 mg total) by mouth every 6 (six) hours as needed for nausea or vomiting. (Patient not taking: Reported on 01/17/2015) 30 tablet 1 Not Taking at Unknown time  . ondansetron (ZOFRAN) 4 MG tablet Take 1 tablet (4 mg total) by mouth every 6 (six) hours. (Patient not taking: Reported on 01/17/2015) 12 tablet 0 Not Taking at Unknown time  . pantoprazole (PROTONIX) 40 MG tablet Take 1 tablet (40 mg total) by mouth daily. (Patient not taking: Reported on 01/17/2015) 30 tablet 1 Not Taking at Unknown time     Review of Systems   All systems reviewed and negative except as stated in HPI  Height 5' (1.524 m), weight 139 lb 15.9 oz (63.5 kg), last menstrual period 05/01/2014. General appearance: alert and cooperative Lungs: clear to auscultation bilaterally Heart: regular rate and rhythm Abdomen: soft, non-tender; bowel sounds normal Pelvic: adequate Extremities: Homans sign is negative, no sign of DVT  Presentation: cephalic Fetal monitoringBaseline: 145 bpm, Variability: Good {> 6 bpm), Accelerations: Reactive and Decelerations: Absent Uterine activity: Frequency: Every 10 minutes     Prenatal labs: ABO, Rh: A/Positive/-- (03/07 0000) Antibody: Negative (03/07  0000) Rubella:   RPR: Nonreactive (03/07 0000)  HBsAg: Negative (03/07 0000)  HIV: Non-reactive (03/07 0000)  GBS: Negative (07/22 0000)  1 hr Glucola  wnl Genetic screening  declined Anatomy US normal  Prenatal Transfer Tool  Maternal Diabetes: No Genetic Screening: Declined Maternal Ultrasounds/Referrals: Normal Fetal Ultrasounds or other Referrals:  None Maternal Substance Abuse:  No Significant Maternal Medications:  None Significant  Maternal Lab Results: Lab values include: Group B Strep negative  Results for orders placed or performed during the hospital encounter of 02/20/15 (from the past 24 hour(s))  CBC   Collection Time: 02/20/15 12:00 PM  Result Value Ref Range   WBC 11.2 (H) 4.0 - 10.5 K/uL   RBC 3.93 3.87 - 5.11 MIL/uL   Hemoglobin 12.8 12.0 - 15.0 g/dL   HCT 65.7 84.6 - 96.2 %   MCV 93.9 78.0 - 100.0 fL   MCH 32.6 26.0 - 34.0 pg   MCHC 34.7 30.0 - 36.0 g/dL   RDW 95.2 84.1 - 32.4 %   Platelets 263 150 - 400 K/uL    Patient Active Problem List   Diagnosis Date Noted  . Pregnancy 02/20/2015    Assessment: Dijon Kohlman is a 24 y.o. G2P1001 at [redacted]w[redacted]d here for PDIOL  #Labor: Induction of labor, plan for cytotec and when favorable, FB #Pain: IV meds prn and epirdural if desired and > 5 cm #FWB: Cat I #ID:  GBS negative #MOF: Breast #MOC: unsure, considering Nexplanon #Circ:  Not desired.   Isa Rankin [facility] 02/20/2015, 12:20 PM

## 2015-02-20 NOTE — Progress Notes (Signed)
Pt may have light laboring diet.

## 2015-02-21 LAB — RPR: RPR Ser Ql: NONREACTIVE

## 2015-02-21 NOTE — Lactation Note (Signed)
This note was copied from the chart of Kayla Yessika Morson. Lactation Consultation Note  Mother eating dinner w/ family. FOB translating.  Mother states she has "no milk". States she can hand express only drops.  Described amount WNL. P2.  Mother exclusively breastfed son for 2 years.  Encouraged mother to breastfeed this baby. Reviewed size of baby's stomach, supply and demand and importance of establishing her milk supply. Mom encouraged to feed baby 8-12 times/24 hours and with feeding cues. Suggest undressing for feedings and STS. Mom made aware of O/P services, breastfeeding support groups, community resources, and our phone # for post-discharge questions.     Patient Name: Kayla Bender Date: 02/21/2015 Reason for consult: Initial assessment   Maternal Data    Feeding Feeding Type: Formula Length of feed: 10 min  LATCH Score/Interventions                      Lactation Tools Discussed/Used     Consult Status Consult Status: Follow-up Date: 02/22/15 Follow-up type: In-patient    Dahlia Byes [facility] 02/21/2015, 6:45 PM

## 2015-02-21 NOTE — Progress Notes (Signed)
Post Partum Day 1  Subjective:  Kayla Bender is a 24 y.o. [id number] [redacted]w[redacted]d s/p NSVD.  No acute events overnight.  Pt denies problems with ambulating, voiding or po intake.  She denies nausea or vomiting.  Pain is moderately controlled.  She has had flatus. She has not had bowel movement.  Lochia Small.  Plan for birth control is undecided..  Method of Feeding: Breast.  Objective: BP 106/65 mmHg  Pulse 67  Temp(Src) 98 F (36.7 C) (Oral)  Resp 18  Ht 5' (1.524 m)  Wt 139 lb (63.05 kg)  BMI 27.15 kg/m2  SpO2 100%  LMP 05/01/2014  Breastfeeding? Unknown  Physical Exam:  General: alert, cooperative and no distress Lochia:normal flow Chest: CTAB Heart: RRR no m/r/g Abdomen: +BS, soft, nontender, fundus firm at/below umbilicus Uterine Fundus: firm DVT Evaluation: No evidence of DVT seen on physical exam. Extremities: trace edema   Recent Labs  02/20/15 1200  HGB 12.8  HCT 36.9    Assessment/Plan:  ASSESSMENT: Kayla Bender is a 24 y.o. G2P2002 [redacted]w[redacted]d ppd #1 s/p NSVD doing well.   Plan for discharge tomorrow   LOS: 1 day   Tarri Abernethy, MD PGY-1 [facility]  02/21/2015, 2:41 PM   OB fellow attestation Post Partum Day 1 I have seen and examined this patient and agree with above documentation in the resident's note.   Kayla Bender is a 24 y.o. [id number] s/p NSVD.  Pt denies problems with ambulating, voiding or po intake. Pain is well controlled.  Plan for birth control is IUD.  Method of Feeding: breast and bottle  PE:  BP 100/59 mmHg  Pulse 63  Temp(Src) 98.4 F (36.9 C) (Oral)  Resp 18  Ht 5' (1.524 m)  Wt 139 lb (63.05 kg)  BMI 27.15 kg/m2  SpO2 100%  LMP 05/01/2014  Breastfeeding? Unknown Fundus firm  Plan for discharge: PPD#2, routine PP care  Federico Flake, MD 12:10 AM

## 2015-02-22 ENCOUNTER — Ambulatory Visit: Payer: Self-pay

## 2015-02-22 DIAGNOSIS — O480 Post-term pregnancy: Secondary | ICD-10-CM

## 2015-02-22 DIAGNOSIS — Z3A41 41 weeks gestation of pregnancy: Secondary | ICD-10-CM

## 2015-02-22 DIAGNOSIS — O9962 Diseases of the digestive system complicating childbirth: Secondary | ICD-10-CM

## 2015-02-22 DIAGNOSIS — K219 Gastro-esophageal reflux disease without esophagitis: Secondary | ICD-10-CM

## 2015-02-22 MED ORDER — IBUPROFEN 600 MG PO TABS: 600.0000 mg | ORAL_TABLET | Freq: Four times a day (QID) | ORAL | Status: AC

## 2015-02-22 NOTE — Discharge Instructions (Signed)

## 2015-02-22 NOTE — Lactation Note (Signed)
This note was copied from the chart of Kayla Keith Lamm. Lactation Consultation Note  Baby was nursing when entered room. Assisted mom with deep latching and using support with positioning. Mom reports some pain with latch that improved with positioning and assisting with deep latch. Aware of support groups and outpatient services.  Patient Name: Kayla Bender [id number] Date: 02/22/2015 Reason for consult: Follow-up assessment   Maternal Data Does the patient have breastfeeding experience prior to this delivery?: Yes  Feeding Feeding Type: Breast Fed Length of feed: 15 min  LATCH Score/Interventions Latch: Grasps breast easily, tongue down, lips flanged, rhythmical sucking.  Audible Swallowing: Spontaneous and intermittent  Type of Nipple: Everted at rest and after stimulation  Comfort (Breast/Nipple): Filling, red/small blisters or bruises, mild/mod discomfort  Problem noted: Mild/Moderate discomfort  Hold (Positioning): Assistance needed to correctly position infant at breast and maintain latch.  LATCH Score: 8  Lactation Tools Discussed/Used     Consult Status      Soyla Dryer 02/22/2015, 3:16 PM

## 2015-02-22 NOTE — Progress Notes (Signed)
Infant safe sleep reinforced several times this shift. Father of Infant insisted on putting several layers of blankets and a hat on infant. Infant safe sleep handout reviewed with  parents. The father stated "[place] use all of these blankets on the baby to keep them warm". Infants temperature 99.6 at one point during the shift and with close monitoring decreased to 99.3. Will continue to promote safe sleep.

## 2015-02-22 NOTE — Discharge Summary (Signed)
OB Discharge Summary  Patient Name: Kayla Bender DOB: [birth year] MRN: [id number]  Date of admission: 02/20/2015 Delivering MD: Tilda Burrow   Date of discharge: 02/22/2015  Admitting diagnosis:Induction of labor   Intrauterine pregnancy: [redacted]w[redacted]d     Secondary diagnosis: None     Discharge diagnosis: Term Pregnancy Delivered  Method of delivery:      Information for the patient's newborn:  Berda, Shelvin [[id number]]  Delivery Method: Vaginal, Spontaneous Delivery (Filed from Delivery Summary)                                                                                                     Post partum procedures:none  Augmentation: Pitocin  Complications:None  Hospital course:  Induction of Labor With Vaginal Delivery   24 y.o. yo [id number] at [redacted]w[redacted]d was admitted to the hospital 02/20/2015 for induction of labor.  Indication for induction: Postdates.  Patient had an uncomplicated labor course as follows:  Mediations and procedures used include: Cytotec and Pitocin      Length of stage 3:                                       Patient had delivery of a Viable infant.  Information for the patient's newborn:  Fynlee, Rowlands [[id number]]  Delivery Method: Vaginal, Spontaneous Delivery (Filed from Delivery Summary)   02/20/2015  Details of delivery can be found in separate delivery note.  Patient had a routine postpartum course. Patient is discharged home No discharge date for patient encounter.    Physical exam  Filed Vitals:   02/21/15 0245 02/21/15 0617 02/21/15 1800 02/22/15 0622  BP: 106/75 106/65 100/59 115/71  Pulse: 80 67 63 65  Temp: 97.6 F (36.4 C) 98 F (36.7 C) 98.4 F (36.9 C) 97.5 F (36.4 C)  TempSrc: Oral Oral Oral Oral  Resp: Height:      Weight:      SpO2: 100% 100% 100%    General: alert, cooperative and no distress Lochia: appropriate Uterine Fundus: firm Incision: N/A DVT Evaluation: No evidence of DVT seen on physical  exam. Labs: Lab Results  Component Value Date   WBC 11.2* 02/20/2015   HGB 12.8 02/20/2015   HCT 36.9 02/20/2015   MCV 93.9 02/20/2015   PLT 263 02/20/2015   CMP Latest Ref Rng 07/30/2014  Glucose 70 - 99 mg/dL 87  BUN 6 - 23 mg/dL 6  Creatinine 6.06 - 3.01 mg/dL 6.01(U)  Sodium 932 - 355 mmol/L 136  Potassium 3.5 - 5.1 mmol/L 3.8  Chloride 96 - 112 mmol/L 109  CO2 19 - 32 mmol/L 23  Calcium 8.4 - 10.5 mg/dL 9.1  Total Protein 6.0 - 8.3 g/dL 6.6  Total Bilirubin 0.3 - 1.2 mg/dL 0.3  Alkaline Phos 39 - 117 U/L 33(L)  AST 0 - 37 U/L 19  ALT 0 - 35 U/L 15    Discharge instruction: per After Visit Summary and "Baby and Me  Booklet".  Medications:   Medication List    STOP taking these medications        acyclovir 400 MG tablet  Commonly known as:  ZOVIRAX     magic mouthwash Soln     metoCLOPramide 10 MG tablet  Commonly known as:  REGLAN     ondansetron 4 MG tablet  Commonly known as:  ZOFRAN     pantoprazole 40 MG tablet  Commonly known as:  PROTONIX      TAKE these medications        ibuprofen 600 MG tablet  Commonly known as:  ADVIL,MOTRIN  Take 1 tablet (600 mg total) by mouth every 6 (six) hours.     prenatal multivitamin Tabs tablet  Take 1 tablet by mouth daily at 12 noon.      ASK your doctor about these medications        Doxylamine-Pyridoxine 10-10 MG Tbec  Commonly known as:  DICLEGIS  Take 1 tablet by mouth 2 (two) times daily.        Diet: routine diet  Activity: Advance as tolerated. Pelvic rest for 6 weeks.   Outpatient follow up:6 weeks  Postpartum contraception: IUD Mirena  Newborn Data: Live born female  Birth Weight: 7 lb 11.5 oz (3501 g) APGAR: 9, 10  Baby Feeding: Breast Disposition:home with mother   02/22/2015 Scheryl Darter, MD

## 2015-02-26 ENCOUNTER — Emergency Department (HOSPITAL_COMMUNITY)
Admission: EM | Admit: 2015-02-26 | Discharge: 2015-02-26 | Disposition: A | Discharge status: Home / Self Care | Payer: Medicaid Other | Attending: Emergency Medicine | Admitting: Emergency Medicine

## 2015-02-26 ENCOUNTER — Encounter (HOSPITAL_COMMUNITY): Payer: Self-pay | Admitting: *Deleted

## 2015-02-26 DIAGNOSIS — N938 Other specified abnormal uterine and vaginal bleeding: Secondary | ICD-10-CM | POA: Insufficient documentation

## 2015-02-26 DIAGNOSIS — Z79899 Other long term (current) drug therapy: Secondary | ICD-10-CM | POA: Insufficient documentation

## 2015-02-26 DIAGNOSIS — Z8719 Personal history of other diseases of the digestive system: Secondary | ICD-10-CM | POA: Diagnosis not present

## 2015-02-26 DIAGNOSIS — R21 Rash and other nonspecific skin eruption: Secondary | ICD-10-CM | POA: Insufficient documentation

## 2015-02-26 MED ORDER — HYDROCORTISONE 1 % EX CREA: TOPICAL_CREAM | CUTANEOUS | Status: AC

## 2015-02-26 NOTE — Discharge Instructions (Signed)
-   Use hydrocortisone cream for rash over stomach - Make a follow up appointment with your PCP in 1 week - Return to ED with fevers, spreading of the rash or further worsening of symptoms

## 2015-02-26 NOTE — ED Notes (Addendum)
Pt with pruritic rash to abdomen since yesterday.  Normal vaginal delivery 7 days ago. Acuity increased per fast track.

## 2015-02-26 NOTE — ED Provider Notes (Signed)
CSN: [id number]     Arrival date & time 02/26/15  1206 History   First MD Initiated Contact with Patient 02/26/15 1241     Chief Complaint  Patient presents with  . Rash   Interview conducted with Kayla Bender interpreter phone service   HPI  [name] is a 24 year old female presenting with an abdominal rash. Pt gave birth seven days ago and reports the rash developed yesterday. It is erythematous and pruritic. Rash is over her abdomen; she has not noticed it over her extremities, back, chest or face. No drainage from the rash. Denies OTC treatment for the rash. Pt is currently breastfeeding. Denies fevers, chills, headaches, chest pain, breast pain, SOB, abdominal pain, nausea, vomiting, diarrhea or muscle aches. Denies recent bug bites, new laundry detergent, new body lotions or new medicines. Endorses scant vaginal bleeding. No new vaginal discharge.   Past Medical History  Diagnosis Date  . GERD (gastroesophageal reflux disease)    Past Surgical History  Procedure Laterality Date  . Esophagogastroduodenoscopy N/A 12/05/2012    Procedure: ESOPHAGOGASTRODUODENOSCOPY (EGD);  Surgeon: Petra Kuba, MD;  Location: [facility] ENDOSCOPY;  Service: Endoscopy;  Laterality: N/A;   No family history on file. Social History  Substance Use Topics  . Smoking status: Never Smoker   . Smokeless tobacco: None  . Alcohol Use: No   OB History    Gravida Para Term Preterm AB TAB SAB Ectopic Multiple Living   2 2 2  0 0 0 0 0 0 2     Review of Systems  Constitutional: Negative for fever and chills.  Respiratory: Negative for shortness of breath.   Cardiovascular: Negative for chest pain.  Gastrointestinal: Negative for nausea, vomiting, abdominal pain and diarrhea.  Genitourinary: Positive for vaginal bleeding. Negative for vaginal discharge.  Musculoskeletal: Negative for myalgias, back pain, arthralgias and neck pain.  Skin: Positive for rash.  Neurological: Negative for dizziness and headaches.       Allergies  Promethazine  Home Medications   Prior to Admission medications   Medication Sig Start Date End Date Taking? Authorizing Provider  ibuprofen (ADVIL,MOTRIN) 600 MG tablet Take 1 tablet (600 mg total) by mouth every 6 (six) hours. 02/22/15  Yes Adam Phenix, MD  Prenatal Vit-Fe Fumarate-FA (PRENATAL MULTIVITAMIN) TABS tablet Take 1 tablet by mouth daily at 12 noon.   Yes Historical Provider, MD  hydrocortisone cream 1 % Apply to affected area 2 times daily 02/26/15   Jalessa Peyser, PA-C   BP 109/69 mmHg  Pulse 80  Temp(Src) 97.6 F (36.4 C) (Oral)  Resp 18  SpO2 99%  LMP 05/01/2014 Physical Exam  Constitutional: She is oriented to person, place, and time. She appears well-developed and well-nourished. No distress.  HENT:  Head: Normocephalic and atraumatic.  Eyes: Conjunctivae and EOM are normal.  Neck: Normal range of motion.  Cardiovascular: Normal rate, regular rhythm and normal heart sounds.   No murmur heard. Pulmonary/Chest: Effort normal and breath sounds normal. No respiratory distress. She has no wheezes. She has no rales.  Abdominal: Soft. She exhibits no distension. There is no tenderness.  Musculoskeletal:  Normal ROM of all extremities  Neurological: She is alert and oriented to person, place, and time.  Skin: Skin is warm and dry.  Multiple small erythematous urticarial patches over lower and mid abdomen. No excoriations. No drainage or crusting. Rash not present over face, chest, back or extremities. Striae present.   Psychiatric: She has a normal mood and affect. Her behavior  is normal.  Nursing note and vitals reviewed.   ED Course  Procedures (including critical care time) Labs Review Labs Reviewed - No data to display  Imaging Review No results found. I have personally reviewed and evaluated these images and lab results as part of my medical decision-making.   EKG Interpretation None      MDM   Final diagnoses:  Rash   Pt  presenting with pruritic rash over abdomen 7 days post delivery. Pt reports the rash appeared last night and is extremely pruritic. It has not spread to any other parts of her body. VSS. Nontoxic pt. On exam, erythematous urticaria over abdomen lower and mid abdomen. Rash has not spread to other parts of the body. No excoriations, drainage or crusting. Pt gave birth 1 week ago so may be a manifestation of PUPP. Will give hydrocortisone cream to apply to abdomen twice a day. Pt to follow up with her OBGYN or PCP in 1 week. Return to ED with fevers, spreading rash, or further worsening of symptoms. Pt agrees with this plan.    Alveta Heimlich, PA-C 02/26/15 1849  Lorre Nick, MD 02/27/15 [phone]

## 2015-02-27 ENCOUNTER — Emergency Department (HOSPITAL_COMMUNITY)
Admission: EM | Admit: 2015-02-27 | Discharge: 2015-02-27 | Disposition: A | Discharge status: Home / Self Care | Payer: Medicaid Other | Attending: Emergency Medicine | Admitting: Emergency Medicine

## 2015-02-27 ENCOUNTER — Encounter (HOSPITAL_COMMUNITY): Payer: Self-pay | Admitting: Emergency Medicine

## 2015-02-27 DIAGNOSIS — Z79899 Other long term (current) drug therapy: Secondary | ICD-10-CM | POA: Insufficient documentation

## 2015-02-27 DIAGNOSIS — R21 Rash and other nonspecific skin eruption: Secondary | ICD-10-CM | POA: Insufficient documentation

## 2015-02-27 DIAGNOSIS — L509 Urticaria, unspecified: Secondary | ICD-10-CM | POA: Diagnosis not present

## 2015-02-27 DIAGNOSIS — Z8719 Personal history of other diseases of the digestive system: Secondary | ICD-10-CM | POA: Diagnosis not present

## 2015-02-27 MED ORDER — DIPHENHYDRAMINE HCL 25 MG PO TABS: 25.0000 mg | ORAL_TABLET | Freq: Four times a day (QID) | ORAL | Status: AC

## 2015-02-27 NOTE — ED Provider Notes (Signed)
CSN: [id number]     Arrival date & time 02/27/15  1008 History  This chart was scribed for non-physician practitioner, Joycie Peek, PA-C working with Margarita Grizzle, MD, by Jarvis Morgan, ED Scribe. This patient was seen in room TR09C/TR09C and the patient's care was started at 10:37 AM.     Chief Complaint  Patient presents with  . Allergic Reaction   The history is provided by the patient. A language interpreter was used [place]).    HPI Comments: Kayla Bender is a 24 y.o. female who presents to the Emergency Department complaining of a red, itchy rash to her abdomen onset 3 days. She states the rash has begun to spread to her bilateral breasts. Pt denies the rash spreading extremities, back or face. She denies any drainage from the rash. Pt reports she gave birth 9 days ago and she is currently breastfeeding. She was in the ER yesterday for the same problem. She was prescribed hydrocortisone cream yesterday. She states she has not been using the medication that was prescribed. Pt's husbands notes they have been applying an oil to her abdomen. She has not followed up with her OB/GYN for this problem. Pt denies any recent bug bites, new laundry detergent, new body lotions, clothing, or medicines. She denies any fever, chills, nausea, vomiting or SOB.  Past Medical History  Diagnosis Date  . GERD (gastroesophageal reflux disease)    Past Surgical History  Procedure Laterality Date  . Esophagogastroduodenoscopy N/A 12/05/2012    Procedure: ESOPHAGOGASTRODUODENOSCOPY (EGD);  Surgeon: Petra Kuba, MD;  Location: [facility] ENDOSCOPY;  Service: Endoscopy;  Laterality: N/A;   No family history on file. Social History  Substance Use Topics  . Smoking status: Never Smoker   . Smokeless tobacco: None  . Alcohol Use: No   OB History    Gravida Para Term Preterm AB TAB SAB Ectopic Multiple Living   2 2 2  0 0 0 0 0 0 2     Review of Systems  Constitutional: Negative for fever and chills.   Respiratory: Negative for shortness of breath.   Gastrointestinal: Negative for nausea and vomiting.  Skin: Positive for rash.  All other systems reviewed and are negative.     Allergies  Promethazine  Home Medications   Prior to Admission medications   Medication Sig Start Date End Date Taking? Authorizing Provider  diphenhydrAMINE (BENADRYL) 25 MG tablet Take 1 tablet (25 mg total) by mouth every 6 (six) hours. 02/27/15   Joycie Peek, PA-C  hydrocortisone cream 1 % Apply to affected area 2 times daily 02/26/15   Stevi Barrett, PA-C  ibuprofen (ADVIL,MOTRIN) 600 MG tablet Take 1 tablet (600 mg total) by mouth every 6 (six) hours. 02/22/15   Adam Phenix, MD  Prenatal Vit-Fe Fumarate-FA (PRENATAL MULTIVITAMIN) TABS tablet Take 1 tablet by mouth daily at 12 noon.    Historical Provider, MD   Triage Vitals: BP 98/56 mmHg  Pulse 76  Temp(Src) 97.4 F (36.3 C) (Oral)  Resp 18  Ht 5\' 1"  (1.549 m)  Wt 131 lb 9.8 oz (59.7 kg)  BMI 24.88 kg/m2  SpO2 100%  LMP 05/01/2014  Physical Exam  Constitutional: She is oriented to person, place, and time. She appears well-developed and well-nourished. No distress.  HENT:  Head: Normocephalic and atraumatic.  Mouth/Throat: Oropharynx is clear and moist and mucous membranes are normal.  No mucous membrane involvement  Eyes: Conjunctivae and EOM are normal.  Neck: Neck supple. No tracheal deviation present.  Cardiovascular:  Normal rate, regular rhythm and normal heart sounds.   Pulmonary/Chest: Effort normal and breath sounds normal. No respiratory distress.  Abdominal: Soft. She exhibits no distension. There is no tenderness.  Musculoskeletal: Normal range of motion.  Neurological: She is alert and oriented to person, place, and time.  Skin: Skin is warm and dry. Rash noted. Rash is urticarial.  Diffuse urticarial rash to anterior abdomen with mild excoriation but no evidence of cellulitis, no draining lesions.  Psychiatric: She has a  normal mood and affect. Her behavior is normal.  Nursing note and vitals reviewed.   ED Course  Procedures (including critical care time)  DIAGNOSTIC STUDIES: Oxygen Saturation is 100% on RA, normal by my interpretation.    COORDINATION OF CARE: 11:22 AM- Pt advised of plan for treatment and pt agrees. Will prescribe pt with Benadryl   Labs Review Labs Reviewed - No data to display  Imaging Review No results found.   EKG Interpretation None     Filed Vitals:   02/27/15 1024 02/27/15 1132 02/27/15 1142  BP: 98/56 90/53 113/68  Pulse: 76 61   Temp: 97.4 F (36.3 C) 97.5 F (36.4 C)   TempSrc: Oral Oral   Resp: 18 18   Height:  (1.549 m)    Weight: 131 lb 9.8 oz (59.7 kg)    SpO2: 100% 100%     MDM  Vitals stable - WNL -afebrile Pt resting comfortably in ED. PE--mild, urticarial rash to her anterior abdomen. No other mucous membrane involvement or other objective findings. Patient has not been taking steroid cream prescribed yesterday for same problem. She has also not followed up with her PCP or OB for this problem. Discussed can use Benadryl in addition to ointment to help with itching. However, will need to follow-up with her PCP and/or OB. No evidence of other acute or emergent pathology at this time. I discussed all relevant lab findings and imaging results with pt and they verbalized understanding. Discussed f/u with PCP within 48 hrs and return precautions, pt very amenable to plan.  Final diagnoses:  Rash    I personally performed the services described in this documentation, which was scribed in my presence. The recorded information has been reviewed and is accurate.    Joycie Peek, PA-C 02/27/15 1505  Margarita Grizzle, MD 02/28/15 1215

## 2015-02-27 NOTE — ED Notes (Signed)
Patient claims had itchy rash to abdomen and chest.  Patient was seen yesterday for same and states the cream she got "did not work".

## 2015-02-27 NOTE — Discharge Instructions (Signed)
Contact Dermatitis Contact dermatitis is a reaction to certain substances that touch the skin. Contact dermatitis can be either irritant contact dermatitis or allergic contact dermatitis. Irritant contact dermatitis does not require previous exposure to the substance for a reaction to occur.Allergic contact dermatitis only occurs if you have been exposed to the substance before. Upon a repeat exposure, your body reacts to the substance.  CAUSES  Many substances can cause contact dermatitis. Irritant dermatitis is most commonly caused by repeated exposure to mildly irritating substances, such as:  Makeup.  Soaps.  Detergents.  Bleaches.  Acids.  Metal salts, such as nickel. Allergic contact dermatitis is most commonly caused by exposure to:  Poisonous plants.  Chemicals (deodorants, shampoos).  Jewelry.  Latex.  Neomycin in triple antibiotic cream.  Preservatives in products, including clothing. SYMPTOMS  The area of skin that is exposed may develop:  Dryness or flaking.  Redness.  Cracks.  Itching.  Pain or a burning sensation.  Blisters. With allergic contact dermatitis, there may also be swelling in areas such as the eyelids, mouth, or genitals.  DIAGNOSIS  Your caregiver can usually tell what the problem is by doing a physical exam. In cases where the cause is uncertain and an allergic contact dermatitis is suspected, a patch skin test may be performed to help determine the cause of your dermatitis. TREATMENT Treatment includes protecting the skin from further contact with the irritating substance by avoiding that substance if possible. Barrier creams, powders, and gloves may be helpful. Your caregiver may also recommend:  Steroid creams or ointments applied 2 times daily. For best results, soak the rash area in cool water for 20 minutes. Then apply the medicine. Cover the area with a plastic wrap. You can store the steroid cream in the refrigerator for a "chilly"  effect on your rash. That may decrease itching. Oral steroid medicines may be needed in more severe cases.  Antibiotics or antibacterial ointments if a skin infection is present.  Antihistamine lotion or an antihistamine taken by mouth to ease itching.  Lubricants to keep moisture in your skin.  Burow's solution to reduce redness and soreness or to dry a weeping rash. Mix one packet or tablet of solution in 2 cups cool water. Dip a clean washcloth in the mixture, wring it out a bit, and put it on the affected area. Leave the cloth in place for 30 minutes. Do this as often as possible throughout the day.  Taking several cornstarch or baking soda baths daily if the area is too large to cover with a washcloth. Harsh chemicals, such as alkalis or acids, can cause skin damage that is like a burn. You should flush your skin for 15 to 20 minutes with cold water after such an exposure. You should also seek immediate medical care after exposure. Bandages (dressings), antibiotics, and pain medicine may be needed for severely irritated skin.  HOME CARE INSTRUCTIONS  Avoid the substance that caused your reaction.  Keep the area of skin that is affected away from hot water, soap, sunlight, chemicals, acidic substances, or anything else that would irritate your skin.  Do not scratch the rash. Scratching may cause the rash to become infected.  You may take cool baths to help stop the itching.  Only take over-the-counter or prescription medicines as directed by your caregiver.  See your caregiver for follow-up care as directed to make sure your skin is healing properly. SEEK MEDICAL CARE IF:   Your condition is not better after 3  days of treatment.  You seem to be getting worse.  You see signs of infection such as swelling, tenderness, redness, soreness, or warmth in the affected area.  You have any problems related to your medicines. Document Released: 06/10/2000 Document Revised: 09/05/2011  Document Reviewed: 11/16/2010 [facility] Patient Information 2015 [place], [place]. This information is not intended to replace advice given to you by your health care provider. Make sure you discuss any questions you have with your health care provider.  Allergies  Allergies may happen from anything your body is sensitive to. This may be food, medicines, pollens, chemicals, and many other things. Food allergies can be severe and deadly.  HOME CARE  If you do not know what causes a reaction, keep a diary. Write down the foods you ate and the symptoms that followed. Avoid foods that cause reactions.  If you have red raised spots (hives) or a rash:  Take medicine as told by your doctor.  Use medicines for red raised spots and itching as needed.  Apply cold cloths (compresses) to the skin. Take a cool bath. Avoid hot baths or showers.  If you are severely allergic:  It is often necessary to go to the hospital after you have treated your reaction.  Wear your medical alert jewelry.  You and your family must learn how to give a allergy shot or use an allergy kit (anaphylaxis kit).  Always carry your allergy kit or shot with you. Use this medicine as told by your doctor if a severe reaction is occurring. GET HELP RIGHT AWAY IF:  You have trouble breathing or are making high-pitched whistling sounds (wheezing).  You have a tight feeling in your chest or throat.  You have a puffy (swollen) mouth.  You have red raised spots, puffiness (swelling), or itching all over your body.  You have had a severe reaction that was helped by your allergy kit or shot. The reaction can return once the medicine has worn off.  You think you are having a food allergy. Symptoms most often happen within 30 minutes of eating a food.  Your symptoms have not gone away within 2 days or are getting worse.  You have new symptoms.  You want to retest yourself with a food or drink you think causes an allergic  reaction. Only do this under the care of a doctor. MAKE SURE YOU:   Understand these instructions.  Will watch your condition.  Will get help right away if you are not doing well or get worse. Document Released: 10/08/2012 Document Reviewed: 10/08/2012 [facility] Patient Information 2015 [place]. This information is not intended to replace advice given to you by your health care provider. Make sure you discuss any questions you have with your health care provider.  Rash A rash is a change in the color or texture of your skin. There are many different types of rashes. You may have other problems that accompany your rash. CAUSES   Infections.  Allergic reactions. This can include allergies to pets or foods.  Certain medicines.  Exposure to certain chemicals, soaps, or cosmetics.  Heat.  Exposure to poisonous plants.  Tumors, both cancerous and noncancerous. SYMPTOMS   Redness.  Scaly skin.  Itchy skin.  Dry or cracked skin.  Bumps.  Blisters.  Pain. DIAGNOSIS  Your caregiver may do a physical exam to determine what type of rash you have. A skin sample (biopsy) may be taken and examined under a microscope. TREATMENT  Treatment depends on the  type of rash you have. Your caregiver may prescribe certain medicines. For serious conditions, you may need to see a skin doctor (dermatologist). HOME CARE INSTRUCTIONS   Avoid the substance that caused your rash.  Do not scratch your rash. This can cause infection.  You may take cool baths to help stop itching.  Only take over-the-counter or prescription medicines as directed by your caregiver.  Keep all follow-up appointments as directed by your caregiver. SEEK IMMEDIATE MEDICAL CARE IF:  You have increasing pain, swelling, or redness.  You have a fever.  You have new or severe symptoms.  You have body aches, diarrhea, or vomiting.  Your rash is not better after 3 days. MAKE SURE YOU:  Understand these  instructions.  Will watch your condition.  Will get help right away if you are not doing well or get worse. Document Released: 06/03/2002 Document Revised: 09/05/2011 Document Reviewed: 03/28/2011 [facility] Patient Information 2015 [place], [place]. This information is not intended to replace advice given to you by your health care provider. Make sure you discuss any questions you have with your health care provider.

## 2018-05-07 ENCOUNTER — Telehealth (HOSPITAL_COMMUNITY): Payer: Self-pay | Admitting: *Deleted
